# Patient Record
Sex: Male | Born: 1990
Health system: Southern US, Community
[De-identification: ages and names within clinical notes are randomized; demographics above are authoritative.]

## PROBLEM LIST (undated history)

## (undated) DIAGNOSIS — F329 Major depressive disorder, single episode, unspecified: Secondary | ICD-10-CM

## (undated) DIAGNOSIS — F32A Depression, unspecified: Secondary | ICD-10-CM

## (undated) DIAGNOSIS — F419 Anxiety disorder, unspecified: Secondary | ICD-10-CM

## (undated) HISTORY — DX: Anxiety disorder, unspecified: F41.9

---

## 2010-07-20 ENCOUNTER — Inpatient Hospital Stay (INDEPENDENT_AMBULATORY_CARE_PROVIDER_SITE_OTHER)
Admission: RE | Admit: 2010-07-20 | Discharge: 2010-07-20 | Disposition: A | Discharge status: Home / Self Care | Payer: Self-pay | Source: Ambulatory Visit | Attending: Family Medicine | Admitting: Family Medicine

## 2010-07-20 DIAGNOSIS — K120 Recurrent oral aphthae: Secondary | ICD-10-CM

## 2013-06-29 ENCOUNTER — Emergency Department (HOSPITAL_COMMUNITY)
Admission: EM | Admit: 2013-06-29 | Discharge: 2013-06-30 | Disposition: A | Discharge status: Other Acute Inpatient Hospital | Payer: Self-pay | Attending: Emergency Medicine | Admitting: Emergency Medicine

## 2013-06-29 ENCOUNTER — Encounter (HOSPITAL_COMMUNITY): Payer: Self-pay | Admitting: Emergency Medicine

## 2013-06-29 ENCOUNTER — Encounter (HOSPITAL_COMMUNITY): Payer: Self-pay | Admitting: *Deleted

## 2013-06-29 ENCOUNTER — Ambulatory Visit (HOSPITAL_COMMUNITY)
Admission: RE | Admit: 2013-06-29 | Discharge: 2013-06-29 | Disposition: A | Discharge status: Home / Self Care | Payer: Self-pay | Attending: Psychiatry | Admitting: Psychiatry

## 2013-06-29 DIAGNOSIS — Z880 Allergy status to penicillin: Secondary | ICD-10-CM | POA: Insufficient documentation

## 2013-06-29 DIAGNOSIS — F329 Major depressive disorder, single episode, unspecified: Secondary | ICD-10-CM | POA: Insufficient documentation

## 2013-06-29 DIAGNOSIS — R45851 Suicidal ideations: Secondary | ICD-10-CM | POA: Insufficient documentation

## 2013-06-29 DIAGNOSIS — Z0289 Encounter for other administrative examinations: Secondary | ICD-10-CM | POA: Insufficient documentation

## 2013-06-29 DIAGNOSIS — F3289 Other specified depressive episodes: Secondary | ICD-10-CM | POA: Insufficient documentation

## 2013-06-29 DIAGNOSIS — Z87891 Personal history of nicotine dependence: Secondary | ICD-10-CM | POA: Insufficient documentation

## 2013-06-29 HISTORY — DX: Major depressive disorder, single episode, unspecified: F32.9

## 2013-06-29 HISTORY — DX: Depression, unspecified: F32.A

## 2013-06-29 LAB — COMPREHENSIVE METABOLIC PANEL
ALT: 24 U/L (ref 0–53)
AST: 19 U/L (ref 0–37)
Albumin: 4.2 g/dL (ref 3.5–5.2)
Alkaline Phosphatase: 52 U/L (ref 39–117)
BUN: 10 mg/dL (ref 6–23)
CO2: 25 mEq/L (ref 19–32)
Calcium: 9.2 mg/dL (ref 8.4–10.5)
Chloride: 104 mEq/L (ref 96–112)
Creatinine, Ser: 0.84 mg/dL (ref 0.50–1.35)
GFR calc non Af Amer: >90 mL/min (ref >90)
GLUCOSE: 72 mg/dL (ref 70–99)
Potassium: 3.5 mEq/L — ABNORMAL LOW (ref 3.7–5.3)
Sodium: 143 mEq/L (ref 137–147)
TOTAL PROTEIN: 7.5 g/dL (ref 6.0–8.3)
Total Bilirubin: 0.4 mg/dL (ref 0.3–1.2)

## 2013-06-29 LAB — CBC
HCT: 41.1 % (ref 39.0–52.0)
HEMOGLOBIN: 14.4 g/dL (ref 13.0–17.0)
MCH: 29.5 pg (ref 26.0–34.0)
MCHC: 35 g/dL (ref 30.0–36.0)
MCV: 84.2 fL (ref 78.0–100.0)
Platelets: 228 10*3/uL (ref 150–400)
RBC: 4.88 MIL/uL (ref 4.22–5.81)
RDW: 12.5 % (ref 11.5–15.5)
WBC: 4.8 10*3/uL (ref 4.0–10.5)

## 2013-06-29 LAB — RAPID URINE DRUG SCREEN, HOSP PERFORMED
Amphetamines: NOT DETECTED
Barbiturates: NOT DETECTED
Benzodiazepines: NOT DETECTED
COCAINE: NOT DETECTED
Opiates: NOT DETECTED
Tetrahydrocannabinol: NOT DETECTED

## 2013-06-29 LAB — ACETAMINOPHEN LEVEL: Acetaminophen (Tylenol), Serum: <15 ug/mL (ref 10–30)

## 2013-06-29 LAB — SALICYLATE LEVEL: Salicylate Lvl: <2 mg/dL — ABNORMAL LOW (ref 2.8–20.0)

## 2013-06-29 LAB — ETHANOL

## 2013-06-29 MED ORDER — ONDANSETRON HCL 4 MG PO TABS: 4.0000 mg | ORAL_TABLET | Freq: Three times a day (TID) | ORAL | Status: DC | PRN

## 2013-06-29 MED ORDER — ALUM & MAG HYDROXIDE-SIMETH 200-200-20 MG/5ML PO SUSP: 30.0000 mL | ORAL | Status: DC | PRN

## 2013-06-29 MED ORDER — IBUPROFEN 200 MG PO TABS: 600.0000 mg | ORAL_TABLET | Freq: Three times a day (TID) | ORAL | Status: DC | PRN

## 2013-06-29 MED ORDER — LORAZEPAM 1 MG PO TABS
1.0000 mg | ORAL_TABLET | Freq: Three times a day (TID) | ORAL | Status: DC | PRN
Start: 2013-06-29 — End: 2013-06-30
  Administered 2013-06-29 – 2013-06-30 (×2): 1 mg via ORAL
  Filled 2013-06-29 (×2): qty 1

## 2013-06-29 MED ORDER — ACETAMINOPHEN 325 MG PO TABS: 650.0000 mg | ORAL_TABLET | ORAL | Status: DC | PRN
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
## 2013-06-29 NOTE — ED Provider Notes (Signed)
Medical screening examination/treatment/procedure(s) were performed by non-physician practitioner and as supervising physician I was immediately available for consultation/collaboration.  EKG Interpretation   None         Rolan BuccoMelanie Albeiro Trompeter, MD 06/29/13 2011

## 2013-06-29 NOTE — ED Notes (Signed)
Security called to wand pt  

## 2013-06-29 NOTE — ED Notes (Addendum)
AC called to inform of patient status.
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
## 2013-06-29 NOTE — ED Notes (Signed)
[facility] depression. States that he has had thoughts in the past couple of days of hurting himself but no plan at this time. 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

## 2013-06-29 NOTE — ED Notes (Signed)
Heather PA at bedside.  

## 2013-06-30 ENCOUNTER — Inpatient Hospital Stay (HOSPITAL_COMMUNITY)
Admission: AD | Admit: 2013-06-30 | Discharge: 2013-07-09 | DRG: 885 | Disposition: A | Discharge status: Home / Self Care | Payer: No Typology Code available for payment source | Source: Intra-hospital | Attending: Psychiatry | Admitting: Psychiatry

## 2013-06-30 ENCOUNTER — Encounter (HOSPITAL_COMMUNITY): Payer: Self-pay | Admitting: Behavioral Health

## 2013-06-30 DIAGNOSIS — R45851 Suicidal ideations: Secondary | ICD-10-CM

## 2013-06-30 DIAGNOSIS — Z87891 Personal history of nicotine dependence: Secondary | ICD-10-CM

## 2013-06-30 DIAGNOSIS — F410 Panic disorder [episodic paroxysmal anxiety] without agoraphobia: Secondary | ICD-10-CM | POA: Diagnosis present

## 2013-06-30 DIAGNOSIS — F1110 Opioid abuse, uncomplicated: Secondary | ICD-10-CM | POA: Diagnosis present

## 2013-06-30 DIAGNOSIS — F411 Generalized anxiety disorder: Secondary | ICD-10-CM | POA: Diagnosis present

## 2013-06-30 DIAGNOSIS — F329 Major depressive disorder, single episode, unspecified: Secondary | ICD-10-CM | POA: Diagnosis present

## 2013-06-30 DIAGNOSIS — F339 Major depressive disorder, recurrent, unspecified: Principal | ICD-10-CM | POA: Diagnosis present

## 2013-06-30 DIAGNOSIS — F1210 Cannabis abuse, uncomplicated: Secondary | ICD-10-CM | POA: Diagnosis present

## 2013-06-30 DIAGNOSIS — F1994 Other psychoactive substance use, unspecified with psychoactive substance-induced mood disorder: Secondary | ICD-10-CM | POA: Diagnosis present

## 2013-06-30 MED ORDER — IBUPROFEN 600 MG PO TABS
600.0000 mg | ORAL_TABLET | Freq: Four times a day (QID) | ORAL | Status: DC | PRN
  Administered 2013-06-30 – 2013-07-07 (×11): 600 mg via ORAL
  Filled 2013-06-30 (×11): qty 1

## 2013-06-30 MED ORDER — TRAZODONE HCL 50 MG PO TABS
50.0000 mg | ORAL_TABLET | Freq: Every evening | ORAL | Status: DC | PRN
  Filled 2013-06-30 (×18): qty 1

## 2013-06-30 MED ORDER — ALUM & MAG HYDROXIDE-SIMETH 200-200-20 MG/5ML PO SUSP
30.0000 mL | ORAL | Status: DC | PRN
Start: 2013-06-30 — End: 2013-07-09

## 2013-06-30 MED ORDER — ACETAMINOPHEN 325 MG PO TABS
650.0000 mg | ORAL_TABLET | Freq: Four times a day (QID) | ORAL | Status: DC | PRN
Start: 2013-06-30 — End: 2013-07-09

## 2013-06-30 MED ORDER — MAGNESIUM HYDROXIDE 400 MG/5ML PO SUSP
30.0000 mL | Freq: Every day | ORAL | Status: DC | PRN
Start: 2013-06-30 — End: 2013-07-09

## 2013-06-30 MED ORDER — HYDROXYZINE HCL 25 MG PO TABS
25.0000 mg | ORAL_TABLET | Freq: Four times a day (QID) | ORAL | Status: DC | PRN
  Administered 2013-06-30 – 2013-07-08 (×6): 25 mg via ORAL
  Filled 2013-06-30 (×6): qty 1

## 2013-06-30 NOTE — ED Provider Notes (Signed)
[facility] by Dr. Dub MikesLugo.    [name], MD 06/30/13 [phone]

## 2013-06-30 NOTE — ED Notes (Signed)
Pt. Standing at nursing station to place call to family.

## 2013-06-30 NOTE — ED Notes (Signed)
Patient oriented to room, bathroom, telephone use, visiting hours.  All info has been given to family members.
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
## 2013-06-30 NOTE — ED Notes (Signed)
Pt. Requested to have ativan before family visits with him. See [facility]

## 2013-06-30 NOTE — ED Notes (Signed)
Rickey EonSharon Guzman, Pts aunt called the unit wanting to know about placement and how soon.

## 2013-06-30 NOTE — ED Notes (Signed)
Patient has had ideas of hurting himself in the last couple of days.  Does not have a plan.  States he was taking Dilaudid and Roxy but stopped about 7 days ago and went through withdrawals for 4 days.  Is here for help with his thoughts.

## 2013-06-30 NOTE — ED Notes (Signed)
Pt. Expressed concerns about being anxious when d/c and commuting to other facility. RN explained the routine and protocol. Also reassured pt. That at no point will they be alone and can request med for anxiety.

## 2013-06-30 NOTE — ED Notes (Signed)
Pt. Is sleeping in bed with eyes closed.

## 2013-06-30 NOTE — BH Assessment (Signed)
06-30-2013 [facility] by Renata Capriceonrad, NP 06/30/2013. The attending is Dr. Elsie SaasJonnalagadda. Room assignment is 506-1. Nurse will have patient complete support paperwork. [facility] will transport.

## 2013-06-30 NOTE — ED Notes (Signed)
Ambulating in the hallway. Family has left.

## 2013-06-30 NOTE — ED Notes (Signed)
Sitter at bedside.

## 2013-06-30 NOTE — Progress Notes (Signed)
[facility] initiated inpatient placement at the following hospitals with bed availability:  1[facility] 4)[facility] 5[facility] 6)[facility] 7)[facility]  Blain PaisMichelle L Shauntelle Jamerson, [facility]/NS

## 2013-06-30 NOTE — BH Assessment (Signed)
Per shift report [facility] initiated inpatient placement at the following hospitals with bed availability 06/30/2012 @ 0459:  1)[facility]  [facility]  [facility]  4)[facility]  5[facility]  6)[facility]  7)[facility]   Writer followed up with the above referrals.  1)[facility]-No beds  2)[facility]-@ capacity 3)[facility]- Under review 4)[facility] - no beds but under review to be placed on their wait list 5)[facility]--Under review 6)[facility]-left a voicemail requesting staff to return call about their bed availability. 7)[facility]-No beds

## 2013-06-30 NOTE — ED Notes (Signed)
[facility] arrived Lorella NimrodHarvey to transport patient.

## 2013-06-30 NOTE — ED Notes (Signed)
Family members arrived. Security paged. Items secured and wanded per protocol.

## 2013-06-30 NOTE — ED Notes (Signed)
EMTALA reviewed by charge RN Melissa.

## 2013-06-30 NOTE — Tx Team (Signed)
Initial Interdisciplinary Treatment Plan  PATIENT STRENGTHS: (choose at least two) Ability for insight Active sense of humor Average or above average intelligence Communication skills General fund of knowledge Motivation for treatment/growth Physical Health Supportive family/friends  PATIENT STRESSORS: Legal issue Medication change or noncompliance Substance abuse   PROBLEM LIST: Problem List/Patient Goals Date to be addressed Date deferred Reason deferred Estimated date of resolution  I want to not be depressed anymore so I don't use drugs 06/30/2013     Substance abuse: cocaine, opiates, marijuana  06/30/2013     Medications adjustment and compliance 06/30/2013     Anxiety 06/30/2013                                    DISCHARGE CRITERIA:  Ability to meet basic life and health needs Improved stabilization in mood, thinking, and/or behavior Motivation to continue treatment in a less acute level of care Verbal commitment to aftercare and medication compliance  PRELIMINARY DISCHARGE PLAN: Attend aftercare/continuing care group Return to previous living arrangement Return to previous work or school arrangements  PATIENT/FAMIILY INVOLVEMENT: This treatment plan has been presented to and reviewed with the patient, Rickey Guzman.  The patient and family have been given the opportunity to ask questions and make suggestions.  Angeline SlimHill, Ashley M 06/30/2013, 8:00 PM

## 2013-06-30 NOTE — ED Notes (Signed)
Called [facility] for transport. 

## 2013-06-30 NOTE — BH Assessment (Signed)
06[facility]30-2013 [facility] by Nanine MeansJamison Lord, NP 06/29/13 and Renata Capriceonrad, NP 06/30/2013. The attending is Dr. Geoffery LyonsIrving Lugo. Room assignment is 302-1. Nurse will have patient complete support paperwork.  [facility] will transport.

## 2013-07-01 DIAGNOSIS — F1110 Opioid abuse, uncomplicated: Secondary | ICD-10-CM | POA: Diagnosis present

## 2013-07-01 DIAGNOSIS — F1210 Cannabis abuse, uncomplicated: Secondary | ICD-10-CM | POA: Diagnosis present

## 2013-07-01 DIAGNOSIS — F329 Major depressive disorder, single episode, unspecified: Secondary | ICD-10-CM

## 2013-07-01 MED ORDER — FLUOXETINE HCL 20 MG PO CAPS
20.0000 mg | ORAL_CAPSULE | Freq: Every day | ORAL | Status: DC
  Administered 2013-07-01: 20 mg via ORAL
  Filled 2013-07-01 (×3): qty 1

## 2013-07-01 MED ORDER — BUPROPION HCL ER (XL) 150 MG PO TB24
150.0000 mg | ORAL_TABLET | Freq: Every day | ORAL | Status: DC
  Administered 2013-07-02 – 2013-07-08 (×7): 150 mg via ORAL
  Filled 2013-07-01 (×8): qty 1
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
## 2013-07-01 NOTE — [facility] Group Notes (Signed)
[facility] LCSW Group Therapy  07/01/2013 2:33 PM  Type of Therapy:  Group Therapy  Participation Level:  Active  Participation Quality:  Attentive  Affect:  Depressed and Flat  Cognitive:  Alert and Oriented  Insight:  Improving  Engagement in Therapy:  Improving  Modes of Intervention:  Discussion, Education, Exploration, Problem-solving, Rapport Building, Socialization and Support  Summary of Progress/Problems: [facility] Speaker came to talk about his personal journey with substance abuse and addiction. The pt processed ways by which to relate to the speaker. MHA speaker provided handouts and educational information pertaining to groups and services offered by the [facility]. [name] remained attentive and engaged throughout today's presentation. He thanked the speaker for coming after group.    Smart, Luise Yamamoto LCSWA  07/01/2013, 2:33 PM

## 2013-07-01 NOTE — Progress Notes (Signed)
Recreation Therapy Notes  Animal-Assisted Activity/Therapy (AAA/T) Program Checklist/Progress Notes Patient Eligibility Criteria Checklist & Daily Group note for Rec Tx Intervention  Date: 01.20.2015 Time: 2:45pm Location: [address] Dayroom    AAA/T Program Assumption of Risk Form signed by Patient/ or Parent Legal Guardian yes  Patient is free of allergies or sever asthma yes  Patient reports no fear of animals yes  Patient reports no history of cruelty to animals yes   Patient understands his/her participation is voluntary yes  Patient washes hands before animal contact yes  Patient washes hands after animal contact yes  Behavioral Response: Appropriate  Education: Hand Washing, Appropriate Animal Interaction   Education Outcome: Acknowledges understanding   Clinical Observations/Feedback: Patient pet therapy dog, smiling while doing so. Patient interacted appropriately with peers during session.   Rickey Guzman L Treston Coker, LRT/CTRS  Rickey Guzman L 07/01/2013 4:19 PM 

## 2013-07-01 NOTE — [facility] Suicide Risk Assessment (Signed)
Suicide Risk Assessment  Admission Assessment     Nursing information obtained from:  Patient Demographic factors:  Male;Adolescent or young adult;Unemployed;Low socioeconomic status;Caucasian Current Mental Status:  Suicidal ideation indicated by patient Loss Factors:  Legal issues Historical Factors:    Risk Reduction Factors:  Sense of responsibility to family;Positive social support;Positive therapeutic relationship  CLINICAL FACTORS:   Severe Anxiety and/or Agitation Depression:   Anhedonia Comorbid alcohol abuse/dependence Hopelessness Impulsivity Insomnia Alcohol/Substance Abuse/Dependencies  COGNITIVE FEATURES THAT CONTRIBUTE TO RISK:  Closed-mindedness Polarized thinking    SUICIDE RISK:   Mild:  Suicidal ideation of limited frequency, intensity, duration, and specificity.  There are no identifiable plans, no associated intent, mild dysphoria and related symptoms, good self-control (both objective and subjective assessment), few other risk factors, and identifiable protective factors, including available and accessible social support.  PLAN OF CARE:1. Admit for crisis management and stabilization. 2. Medication management to reduce current symptoms to base line and improve the     patient's overall level of functioning 3. Treat health problems as indicated. 4. Develop treatment plan to decrease risk of relapse upon discharge and the need for     readmission. 5. Psycho-social education regarding relapse prevention and self care. 6. Health care follow up as needed for medical problems. 7. Restart home medications where appropriate.   I certify that inpatient services furnished can reasonably be expected to improve the patient's condition.  Skylie Hiott,MD 07/01/2013, 10:58 AM

## 2013-07-01 NOTE — Progress Notes (Signed)
D: Patient in the hallway on approach.  Patient minimal with Clinical research associatewriter.  Patient states he had a good day.  Patient states he had been attending groups and states he is learning a lot.  Patient states he has learned that there is help afterwards and that he is not the only one battling depression and substance abuse.  Patient passive SI but verbally contracts for safety.  Paient denies HI/AVH. A: Staff to monitor Q 15 mins for safety.  Encouragement and support offered.  Scheduled medications administered per orders. R: Patient remains safe on the unit.  Patient attended group tonight.  Patient visible on the unit and interacting with peers.  Patient taking administered medications.

## 2013-07-01 NOTE — [facility] Group Notes (Signed)
Adult Psychoeducational Group Note  Date:  07/01/2013 Time:  11:41 PM  Group Topic/Focus:  Wrap-Up Group:   The focus of this group is to help patients review their daily goal of treatment and discuss progress on daily workbooks.  Participation Level:  Active  Participation Quality:  Appropriate  Affect:  Appropriate  Cognitive:  Appropriate  Insight: Appropriate  Engagement in Group:  Engaged  Modes of Intervention:  Discussion  Additional Comments:  [name] said his day was descent.  He went to the groups and meals. He said he had a miniature breakdown before dinner but that his grandfather helped calm him down when he visited.  He also expressed he liked pet therapy.  Caroll RancherLindsay, Adalyna Godbee A 07/01/2013, 11:41 PM

## 2013-07-01 NOTE — [facility] Suicide Risk Assessment (Signed)
[facility] INPATIENT:  Family/Significant Other Suicide Prevention Education  Suicide Prevention Education:  Education Completed; Rickey Guzman (pt's grandfather) [phone] has been identified by the patient as the family member/significant other with whom the patient will be residing, and identified as the person(s) who will aid the patient in the event of a mental health crisis (suicidal ideations/suicide attempt).  With written consent from the patient, the family member/significant other has been provided the following suicide prevention education, prior to the and/or following the discharge of the patient.  The suicide prevention education provided includes the following:  Suicide risk factors  Suicide prevention and interventions  National Suicide Hotline telephone number  [facility] assessment telephone number  [facility] Emergency Assistance 911  [facility] and/or Residential Mobile Crisis Unit telephone number  Request made of family/significant other to:  Remove weapons (e.g., guns, rifles, knives), all items previously/currently identified as safety concern.    Remove drugs/medications (over-the-counter, prescriptions, illicit drugs), all items previously/currently identified as a safety concern.  The family member/significant other verbalizes understanding of the suicide prevention education information provided.  The family member/significant other agrees to remove the items of safety concern listed above.  Smart, HeatherLCSWA 07/01/2013, 11:02 AM

## 2013-07-01 NOTE — Progress Notes (Signed)
Patient ID: Myrtis SerKaleb D Moga, male   DOB: [birth year], 23 y.o.   MRN: [id number] He has been up and about and to groups interacting with peers and staff.  Self inventory: depression 5, hopelessness 5 denies W/D symptoms and pain. Had vistaril prn at 1210  For anxiety and it was effective.

## 2013-07-02 DIAGNOSIS — F1110 Opioid abuse, uncomplicated: Secondary | ICD-10-CM

## 2013-07-02 DIAGNOSIS — F1210 Cannabis abuse, uncomplicated: Secondary | ICD-10-CM

## 2013-07-02 NOTE — Progress Notes (Signed)
D: Patient appropriate and cooperative with staff and peers. Patient has depressed affect and mood. He reported on the self inventory sheet that he's sleeping fair, appetite and ability to pay attention are both improving and energy level is low. Patient rated depression "7" and feelings of hopelessness "5". He's participating in groups and visible in the milieu. Patient is adhering to current medication regimen.   A: Support and encouragement provided to patient. Administered scheduled medication per ordering MD. Monitor Q15 minute checks for safety.  R: Patient receptive. Passive SI, but contracts for safety. Denies HI and auditory/visual hallucinations. Patient remains safe on th unit.

## 2013-07-02 NOTE — [facility] Group Notes (Signed)
[facility]   07/02/2013 9:37 AM  Participation Quality:  Appropriate   Mood/Affect:  Appropriate  Depression Rating:  7  Anxiety Rating:  7  Thoughts of Suicide:  No Will you contract for safety?   NA  Current AVH:  No  Plan for Discharge/Comments:  Pt reports that he needs letter faxed to his lawyer with specific information today in order to avoid having warrant put out for his arrest. Pt reports no withdrawals and good sleep but states that his depression and anxiety is still high. [facility].   Transportation Means: family member  Supports: family Manufacturing engineermembers-grandfather, aunt, and mother  Smart, Lebron QuamHeather LCSWA

## 2013-07-02 NOTE — [facility] Group Notes (Signed)
Adult Psychoeducational Group Note  Date:  07/02/2013 Time:  10:05 PM  Group Topic/Focus:  AA Meeting  Participation Level:  Minimal  Participation Quality:  Appropriate  Affect:  Appropriate  Cognitive:  Appropriate  Insight: Limited  Engagement in Group:  Limited  Modes of Intervention:  Discussion and Education  Additional Comments:  Reyn attended Morgan StanleyA meeting.  Caroll RancherLindsay, Felica Chargois A 07/02/2013, 10:05 PM

## 2013-07-02 NOTE — Clinical Social Work Note (Signed)
Per pt request, CSW faxed generic letter including admission date and unknown discharge date info to lawyer-Scott Colter at [phone] number provided by pt and his aunt. Pt signed ROI and was given fax and fax confirmation for his records.  The Sherwin-WilliamsHeather Smart, LCSWA  07/02/2013 11:22 AM

## 2013-07-02 NOTE — [facility] Group Notes (Signed)
[facility] LCSW Group Therapy  07/02/2013 3:36 PM  Type of Therapy:  Group Therapy  Participation Level:  Active  Participation Quality:  Attentive  Affect:  Appropriate  Cognitive:  Alert and Oriented  Insight:  Engaged  Engagement in Therapy:  Engaged  Modes of Intervention:  Discussion, Education, Exploration, Limit-setting, Rapport Building, Socialization and Support  Summary of Progress/Problems: Emotion Regulation: This group focused on both positive and negative emotion identification and allowed group members to process ways to identify feelings, regulate negative emotions, and find healthy ways to manage internal/external emotions. Group members were asked to reflect on a time when their reaction to an emotion led to a negative outcome and explored how alternative responses using emotion regulation would have benefited them. Group members were also asked to discuss a time when emotion regulation was utilized when a negative emotion was experienced. Rickey Guzman was engaged and attentive throughout today's therapy group. He stated that he struggles with depression and anxiety. "these two emotions intertwine for me and often cause me to feel suicidal. I have to be around my family to feel safe." Rickey Guzman shows progress in the group setting and improving insight AEB his ability and improving insight AEB his ability to identify how having supportive family and pursuing hobbies such as hunting; working out; going for walks to [employer]"destress" can further assist him with managing depression and anxiety symptoms. Rickey Guzman also stated that being on medication to help him with mood stabilization may be beneficial to him.    Smart, Tenise Stetler LCSWA  07/02/2013, 3:36 PM
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
## 2013-07-02 NOTE — Progress Notes (Addendum)
[facility] MD Progress Note  07/02/2013 2:27 PM Rickey Guzman  MRN:  [id number]  Subjective:  Rickey Guzman continues to endorse high anxiety and depression levels. He rated both anxiety/depression at #8. He says that he is still having fleeting suicidal thoughts, however denies any intent and or plans to hurt himself and or others. Feels his antidepressant is trying to kick in. May decide to switch the dosing time from am to PM.  Diagnosis:   DSM5: Schizophrenia Disorders:  NA Obsessive-Compulsive Disorders:  NA Trauma-Stressor Disorders:  NA Substance/Addictive Disorders:  Cannabis Use Disorder - Moderate 9304.30) and Opioid Disorder - Moderate (304.00) Depressive Disorders:  Major Depressive Disorder - Moderate (296.22)  Axis I: Cannabis Use Disorder - Moderate 9304.30) and Opioid Disorder - Moderate (304.00), Major depressive disorder, recurrent Axis II: Deferred Axis III:  Past Medical History  Diagnosis Date  . Depression    Axis IV: other psychosocial or environmental problems, problems related to legal system/crime and problems with primary support group Axis V: 41-50 serious symptoms  ADL's:  Intact  Sleep: Good  Appetite:  Good  Suicidal Ideation:  Plan:  Denies Intent:  Denies Means:  Denies  Homicidal Ideation:  Plan:  Denies Intent:  Denies Means:  Denies  AEB (as evidenced by): per patient's reports  Psychiatric Specialty Exam: Review of Systems  Constitutional: Positive for malaise/fatigue.  Eyes: Negative.   Cardiovascular: Negative.   Gastrointestinal: Negative.   Genitourinary: Negative.   Musculoskeletal: Negative.   Skin: Negative.   Neurological: Negative.   Endo/Heme/Allergies: Negative.   Psychiatric/Behavioral: Positive for depression (Rated #8) and substance abuse (Opiate/Cannabis abuse). Negative for suicidal ideas, hallucinations and memory loss. The patient is nervous/anxious (Rated #8). The patient does not have insomnia.     Blood pressure  116/70, pulse 80, temperature 98 F (36.7 C), temperature source Oral, resp. rate 20, height 6' (1.829 m), weight 84.823 kg (187 lb), SpO2 100.00%.Body mass index is 25.36 kg/(m^2).  General Appearance: Casual and Fairly Groomed  Eye Contact::  Good  Speech:  Clear and Coherent  Volume:  Normal  Mood:  Anxious and Depressed, rated both at #8  Affect:  Flat  Thought Process:  Coherent, Goal Directed and Intact  Orientation:  Full (Time, Place, and Person)  Thought Content:  Rumination and Denies any hallucinations  Suicidal Thoughts:  Yes.  without intent/plan (fleeting)  Homicidal Thoughts:  No  Memory:  Immediate;   Good Recent;   Good Remote;   Good  Judgement:  Fair  Insight:  Fair  Psychomotor Activity:  High anxiety level  Concentration:  Fair  Recall:  Good  Akathisia:  No  Handed:  Right  AIMS (if indicated):     Assets:  Communication Skills Desire for Improvement Physical Health  Sleep:  Number of Hours: 6   Current Medications: Current Facility-Administered Medications  Medication Dose Route Frequency Provider Last Rate Last Dose  . acetaminophen (TYLENOL) tablet 650 mg  650 mg Oral Q6H PRN Kerry Hough, PA-C      . alum & mag hydroxide-simeth (MAALOX/MYLANTA) 200-200-20 MG/5ML suspension 30 mL  30 mL Oral Q4H PRN Kerry Hough, PA-C      . buPROPion (WELLBUTRIN XL) 24 hr tablet 150 mg  150 mg Oral Daily Sanjuana Kava, NP   150 mg at 07/02/13 0804  . hydrOXYzine (ATARAX/VISTARIL) tablet 25 mg  25 mg Oral Q6H PRN Kerry Hough, PA-C   25 mg at 07/01/13 2214  . ibuprofen (ADVIL,MOTRIN) tablet 600  mg  600 mg Oral Q6H PRN Kerry HoughSpencer E Simon, PA-C   600 mg at 07/01/13 1959  . magnesium hydroxide (MILK OF MAGNESIA) suspension 30 mL  30 mL Oral Daily PRN Kerry HoughSpencer E Simon, PA-C      . traZODone (DESYREL) tablet 50 mg  50 mg Oral QHS,MR X 1 Kerry HoughSpencer E Simon, PA-C        Lab Results: No results found for this or any previous visit (from the past 48 hour(s)).  Physical  Findings: AIMS: Facial and Oral Movements Muscles of Facial Expression: None, normal Lips and Perioral Area: None, normal Jaw: None, normal Tongue: None, normal,Extremity Movements Upper (arms, wrists, hands, fingers): None, normal Lower (legs, knees, ankles, toes): None, normal, Trunk Movements Neck, shoulders, hips: None, normal, Overall Severity Severity of abnormal movements (highest score from questions above): None, normal Incapacitation due to abnormal movements: None, normal Patient's awareness of abnormal movements (rate only patient's report): No Awareness, Dental Status Current problems with teeth and/or dentures?: No Does patient usually wear dentures?: No  CIWA:  CIWA-Ar Total: 0 COWS:     Treatment Plan Summary: Daily contact with patient to assess and evaluate symptoms and progress in treatment Medication management  Plan: Review of chart, vital signs, medications, and notes. 1- Individual and group therapy 2- Medication management for depression and anxiety:  Medications reviewed with the patient, may change dosing time for Wellbutrin from am to Q hs. 3- Coping skills for depression, anxiety, and substance dependency 4- Continue crisis stabilization and management 5- Address health issues--monitoring vital signs, stable 6- Treatment plan in progress to prevent relapse of depression, substance abuse, and anxiety  Medical Decision Making Problem Points:  Established problem, stable/improving (1) Data Points:  Review of medication regiment & side effects (2) Review of new medications or change in dosage (2)  I certify that inpatient services furnished can reasonably be expected to improve the patient's condition.   Sanjuana Kavawoko, Agnes I, PMHNP-BC 07/02/2013, 2:27 PM  I agreed with findings and treatment plan of this patient Thedore MinsMojeed Beuna Bolding, MD

## 2013-07-02 NOTE — Progress Notes (Signed)
D: Patient in the hallway on approach.  Patient tearful and states he received bad news today.  Patient states he found out from his family that there is a possibility he may have to spend 30 days in jail due to being here.  Patient states he is supposed to serve weekends in jail and due to him being here he wont be able to.  Patient states, "I can't not go back to jail I will kill myself before I go back."  Patient passive SI but verbally contracts for safety.  Patient denies HI and denies AVH.   A: Staff to monitor Q 15 mins for safety.  Encouragement and support offered.  No scheduled medications administered per orders. R: Patient remains safe on the unit.  Patient attended group tonight.  Patient taking administered medications.  Patient visible on the unit and interacting with peers.

## 2013-07-03 DIAGNOSIS — F410 Panic disorder [episodic paroxysmal anxiety] without agoraphobia: Secondary | ICD-10-CM | POA: Diagnosis present

## 2013-07-03 DIAGNOSIS — F411 Generalized anxiety disorder: Secondary | ICD-10-CM | POA: Diagnosis present

## 2013-07-03 MED ORDER — GABAPENTIN 100 MG PO CAPS
100.0000 mg | ORAL_CAPSULE | Freq: Three times a day (TID) | ORAL | Status: DC
Start: 2013-07-03 — End: 2013-07-04
  Administered 2013-07-03 – 2013-07-04 (×4): 100 mg via ORAL
  Filled 2013-07-03 (×9): qty 1

## 2013-07-03 NOTE — [facility] Group Notes (Signed)
[facility] LCSW Group Therapy  07/03/2013 2:42 PM  Type of Therapy:  Group Therapy  Participation Level:  Active  Participation Quality:  Attentive  Affect:  Appropriate  Cognitive:  Alert and Oriented  Insight:  Engaged  Engagement in Therapy:  Engaged  Modes of Intervention:  Confrontation, Discussion, Education, Exploration, Problem-solving, Rapport Building, Socialization and Support  Summary of Progress/Problems:  Finding Balance in Life. Today's group focused on defining balance in one's own words, identifying things that can knock one off balance, and exploring healthy ways to maintain balance in life. Group members were asked to provide an example of a time when they felt off balance, describe how they handled that situation,and process healthier ways to regain balance in the future. Group members were asked to share the most important tool for maintaining balance that they [facility] and how they plan to apply this method after discharge. Rickey Guzman was attentive and engaged throughout today's therapy group. He shared that "panic attacks, legal problems, and family issues" have contributed to the imbalance he feels currently. Rickey Guzman shows progress in the group setting and improving insight AEB his ability to process how he abuses substances in an attempt to reestablish balance but how this in turn further knocks him off balance in life. Rickey Guzman shared that he thinks inpatient treatment will be necessary for him to learn coping skills to help deal with life stressors without turning to drugs.    Smart, Rickey Guzman LCSWA  07/03/2013, 2:42 PM

## 2013-07-03 NOTE — Progress Notes (Signed)
Recreation Therapy Notes  Date: 01.21.2015 Time: 3:00pm Location: [address] Dayroom   Group Topic: Communication, Team Building, Problem Solving  Goal Area(s) Addresses:  Patient will effectively work with peer towards shared goal.  Patient will identify skill used to make activity successful.  Patient will identify how skills used during activity can be used to reach post d/c goals.   Behavioral Response: Appropriate   Intervention: Problem Solving Activity  Activity: Landing Pad. In teams patients were given 12 plastic drinking straws and a length of masking tape. Using the materials provided patients were asked to build a landing pad to catch a golf ball dropped from approximately 6 feet in the air.   Education: Pharmacist, communityocial Skills, Building control surveyorDischarge Planning.   Education Outcome: Acknowledges understanding  Clinical Observations/Feedback: Patient actively engaged in group activity, sharing ideas with teammates and assisting with construction of team's landing pad. Patient contributed to group discussion, identifying qualities used to guide his decision making, patient additionally identified positive impact using group skills effectively could have on his life.   Marykay Lexenise L Clint Biello, LRT/CTRS  Clayson Riling L 07/03/2013 8:58 AM

## 2013-07-03 NOTE — Progress Notes (Signed)
D: pt sitting in dayroom engaging with other, being very friendly with pt amber. Writer spoke with pt alone. Writer informed pt that the unit is not the place to find love or friends, the purpose of the pt being here is to better himself. Pt agreed and stated there is nothing going on between him and the other pt, she is just something to help make the days go by faster. Denies si/hi/avh at the moment, but stated he was having si earlier in the morning, it comes and goes as it pleases. Pt contracts for safety. Pt c/o pain in lower back and joints. Pt stated he knows the pain is due to his chronic use of drugs. Pt is cooperative A: ibuprofen given for pain. q 15 min safety checks R: pt remains safe on unit

## 2013-07-03 NOTE — Progress Notes (Signed)
Recreation Therapy Notes  Animal-Assisted Activity/Therapy (AAA/T) Program Checklist/Progress Notes Patient Eligibility Criteria Checklist & Daily Group note for Rec Tx Intervention  Date: 01.22.2015 Time: 2:45pm [address]: [address]    AAA/T Program Assumption of Risk Form signed by Patient/ or Parent Legal Guardian yes  Patient is free of allergies or sever asthma yes  Patient reports no fear of animals yes  Patient reports no history of cruelty to animals yes   Patient understands his/her participation is voluntary yes  Patient washes hands before animal contact yes  Patient washes hands after animal contact yes  Behavioral Response: Engaged, Appropraite  Education: Charity fundraiserHand Washing, Appropriate Animal Interaction   Education Outcome: Acknowledges understanding  Clinical Observations/Feedback: Patient interacted appropriately with peers and therapy dog team.   Jearl Klinefelterenise L Naven Giambalvo, LRT/CTRS  Issabella Rix L 07/03/2013 3:41 PM

## 2013-07-03 NOTE — Progress Notes (Signed)
D: Patient has depressed affect and anxious mood; also presenting with a worried appearance as well. He has been somewhat tearful this morning, expressing concern about speaking with a sheriff regarding probation and some time that he's suppose to be serving right now. Patient did get the opportunity today to have a conversation with the sheriff to get clarification about the steps to take next after he's been discharged from the hospital. He's attending groups and compliant with his medications.  A: Support and encouragement provided to patient. Scheduled medications administered per MD orders. Maintain Q15 minute checks for safety.  R: Patient receptive. Passive SI, but contracts for safety. Denies homicidal ideation. Patient remains safe.

## 2013-07-03 NOTE — Progress Notes (Signed)
Adult Psychoeducational Group Note  Date:  07/03/2013 Time:  9:00 AM  Group Topic/Focus:  Morning Wellness Group  Participation Level:  Active  Participation Quality:  Appropriate and Attentive  Affect:  Appropriate  Cognitive:  Alert and Oriented  Insight: Appropriate  Engagement in Group:  Engaged  Modes of Intervention:  Discussion  Additional Comments:  Pt. stated a goal for him is to work on his mental stability.  [name], [name] 07/03/2013, 9:51 AM

## 2013-07-03 NOTE — Progress Notes (Signed)
Patient ID: Rickey Guzman, male   DOB: [birth year], 23 y.o.   MRN: [id number] Pt attended AA group.

## 2013-07-03 NOTE — Progress Notes (Signed)
Patient ID: Rickey Guzman, male   DOB: 1990/06/17, 23 y.o.   MRN: [id number] Pt went down stairs to the gym for fitness. He played basketball the entire time.

## 2013-07-03 NOTE — Progress Notes (Addendum)
[facility] Group Notes:  (Nursing/MHT/Case Management/Adjunct)  Date:  07/03/2013   Time: 2100  Type of Therapy:  wrap up group  Participation Level:  Active  Participation Quality:  Inattentive, Redirectable and Sharing  Affect:  Appropriate  Cognitive:  Appropriate  Insight:  Lacking  Engagement in Group:  Limited  Modes of Intervention:  Clarification, Education and Support  Summary of Progress/Problems: Pt reports feeling super depressed but feeling better after having talked with Dr. Dub MikesLugo. Pt plans on attending [facility] upon discharge but expresses anxiety about going. Pt reports needing to go but concerned about strict phone times and schedules.   Shelah LewandowskySquires, Teanna Elem Carol 07/03/2013, 9:28 PM

## 2013-07-03 NOTE — Progress Notes (Signed)
[facility] MD Progress Note  07/03/2013 2:49 PM Rickey Guzman  MRN:  [id number] Subjective:  Rickey Guzman endorses that he continues to have a hard time. States that every time he starts thinking of having to go back to jail he panics and starts feeling like killing himself. States he was in the process of getting his life back together, had a job, but he had to do weekends in jail due to a pos MJ test. He lost his job due to not being able to be there. He panicked and panic every time when he thinks about going back there Diagnosis:   DSM5: Schizophrenia Disorders:  none Obsessive-Compulsive Disorders:  none Trauma-Stressor Disorders:  none Substance/Addictive Disorders:  Cannabis Use Disorder - Moderate 9304.30) and Opioid Disorder - Moderate (304.00) Depressive Disorders:  Major Depressive Disorder - Moderate (296.22)  Axis I: Anxiety Disorder NOS and Panic Disorder  ADL's:  Intact  Sleep: Poor  Appetite:  Fair  Suicidal Ideation:  Plan:  denies Intent:  denies Means:  denies Homicidal Ideation:  Plan:  denies Intent:  denies Means:  denies AEB (as evidenced by):  Psychiatric Specialty Exam: Review of Systems  Constitutional: Negative.   HENT: Negative.   Eyes: Negative.   Respiratory: Negative.   Cardiovascular: Negative.   Gastrointestinal: Negative.   Genitourinary: Negative.   Musculoskeletal: Negative.   Skin: Negative.   Neurological: Negative.   Endo/Heme/Allergies: Negative.   Psychiatric/Behavioral: Positive for depression, suicidal ideas and substance abuse. The patient is nervous/anxious and has insomnia.     Blood pressure 127/76, pulse 70, temperature 97.7 F (36.5 C), temperature source Oral, resp. rate 18, height 6' (1.829 m), weight 84.823 kg (187 lb), SpO2 100.00%.Body mass index is 25.36 kg/(m^2).  General Appearance: Fairly Groomed  Patent attorney::  Fair  Speech:  Clear and Coherent  Volume:  fluctuates  Mood:  Anxious, Depressed and worried  Affect:   Depressed, Tearful and anxious  Thought Process:  Coherent and Goal Directed  Orientation:  Full (Time, Place, and Person)  Thought Content:  symptoms, worries, concerns  Suicidal Thoughts:  Yes, with plans no intent at this moment  Homicidal Thoughts:  No  Memory:  Immediate;   Fair Recent;   Fair Remote;   Fair  Judgement:  Fair  Insight:  Present  Psychomotor Activity:  Restlessness  Concentration:  Fair  Recall:  Fair  Akathisia:  No  Handed:    AIMS (if indicated):     Assets:  Desire for Improvement  Sleep:  Number of Hours: 6.25   Current Medications: Current Facility-Administered Medications  Medication Dose Route Frequency Provider Last Rate Last Dose  . acetaminophen (TYLENOL) tablet 650 mg  650 mg Oral Q6H PRN Kerry Hough, PA-C      . alum & mag hydroxide-simeth (MAALOX/MYLANTA) 200-200-20 MG/5ML suspension 30 mL  30 mL Oral Q4H PRN Kerry Hough, PA-C      . buPROPion (WELLBUTRIN XL) 24 hr tablet 150 mg  150 mg Oral Daily Sanjuana Kava, NP   150 mg at 07/03/13 0815  . gabapentin (NEURONTIN) capsule 100 mg  100 mg Oral TID Rachael Fee, MD   100 mg at 07/03/13 1154  . hydrOXYzine (ATARAX/VISTARIL) tablet 25 mg  25 mg Oral Q6H PRN Kerry Hough, PA-C   25 mg at 07/02/13 2157  . ibuprofen (ADVIL,MOTRIN) tablet 600 mg  600 mg Oral Q6H PRN Kerry Hough, PA-C   600 mg at 07/02/13 2157  . magnesium hydroxide (  MILK OF MAGNESIA) suspension 30 mL  30 mL Oral Daily PRN Kerry HoughSpencer E Simon, PA-C      . traZODone (DESYREL) tablet 50 mg  50 mg Oral QHS,MR X 1 Kerry HoughSpencer E Simon, PA-C        Lab Results: No results found for this or any previous visit (from the past 48 hour(s)).  Physical Findings: AIMS: Facial and Oral Movements Muscles of Facial Expression: None, normal Lips and Perioral Area: None, normal Jaw: None, normal Tongue: None, normal,Extremity Movements Upper (arms, wrists, hands, fingers): None, normal Lower (legs, knees, ankles, toes): None, normal, Trunk  Movements Neck, shoulders, hips: None, normal, Overall Severity Severity of abnormal movements (highest score from questions above): None, normal Incapacitation due to abnormal movements: None, normal Patient's awareness of abnormal movements (rate only patient's report): No Awareness, Dental Status Current problems with teeth and/or dentures?: No Does patient usually wear dentures?: No  CIWA:  CIWA-Ar Total: 0 COWS:     Treatment Plan Summary: Daily contact with patient to assess and evaluate symptoms and progress in treatment Medication management  Plan: Supportive approach/coping skills/relape prevention           Continue the trial with Wellbutrin XL 150 mg daily (has tried Celexa, Zoloft, Paxil, Lexapro with no benefit after several months of daily use)           Trial with Neurontin to help with the anxiety, panic feeling           CBT;mindfulness Medical Decision Making Problem Points:  Established problem, worsening (2) and Review of psycho-social stressors (1) Data Points:  Review of medication regiment & side effects (2) Review of new medications or change in dosage (2)  I certify that inpatient services furnished can reasonably be expected to improve the patient's condition.   Kortny Lirette A 07/03/2013, 2:49 PM

## 2013-07-04 DIAGNOSIS — F410 Panic disorder [episodic paroxysmal anxiety] without agoraphobia: Secondary | ICD-10-CM

## 2013-07-04 DIAGNOSIS — F411 Generalized anxiety disorder: Secondary | ICD-10-CM

## 2013-07-04 MED ORDER — GABAPENTIN 100 MG PO CAPS
200.0000 mg | ORAL_CAPSULE | Freq: Three times a day (TID) | ORAL | Status: DC
Start: 2013-07-04 — End: 2013-07-07
  Administered 2013-07-04 – 2013-07-06 (×7): 200 mg via ORAL
  Filled 2013-07-04 (×11): qty 2

## 2013-07-04 NOTE — Progress Notes (Signed)
[facility]  07/04/2013 2:30 PM Rickey Guzman  MRN:  [id number] Subjective:  Rickey Guzman states that he can be OK when he is around other people but once he is by himself he starts thinking, ruminating. He states that the thought of having to go back to jail makes his anxiety, panic feelings worst and the thoughts of killing himself come back. He admits he has been trying to work trough the fact that he will always be a felon and that at least for the next 6 years he will have to register as a sex offender ( It was 10 years. He  cant belief that 4 years have gone so fast.) he is planning to build his skills up to be self employed given his felon status Diagnosis:   DSM5: Schizophrenia Disorders:  none Obsessive-Compulsive Disorders:  none Trauma-Stressor Disorders:  none Substance/Addictive Disorders:  Cannabis Use Disorder - Moderate 9304.30) and Opioid Disorder - Moderate (304.00) Depressive Disorders:  Major Depressive Disorder - Moderate (296.22)  Axis I: Anxiety Disorder NOS and Panic Disorder  ADL's:  Intact  Sleep: Poor  Appetite:  Fair  Suicidal Ideation:  Plan:  denies Intent:  denies Means:  denies Homicidal Ideation:  Plan:  denies Intent:  denies Means:  denies AEB (as evidenced by):  Psychiatric Specialty Exam: Review of Systems  Constitutional: Negative.   HENT: Negative.   Eyes: Negative.   Respiratory: Negative.   Cardiovascular: Negative.   Gastrointestinal: Negative.   Genitourinary: Negative.   Musculoskeletal: Negative.   Skin: Negative.   Neurological: Positive for tremors.  Endo/Heme/Allergies: Negative.   Psychiatric/Behavioral: Positive for depression and substance abuse. The patient is nervous/anxious.     Blood pressure 146/78, pulse 90, temperature 97.3 F (36.3 C), temperature source Oral, resp. rate 16, height 6' (1.829 m), weight 84.823 kg (187 lb), SpO2 100.00%.Body mass index is 25.36 kg/(m^2).  General Appearance: Fairly Groomed   Patent attorneyye Contact::  Fair  Speech:  Clear and Coherent  Volume:  fluctuates  Mood:  Anxious, Depressed and worried  Affect:  anxious, worried  Thought Process:  Coherent and Goal Directed  Orientation:  Full (Time, Place, and Person)  Thought Content:  events, worries, concerns  Suicidal Thoughts:  Yes.  without intent/plan  Homicidal Thoughts:  No  Memory:  Immediate;   Fair Recent;   Fair Remote;   Fair  Judgement:  Fair  Insight:  Present  Psychomotor Activity:  Restlessness  Concentration:  Fair  Recall:  Fair  Akathisia:  No  Handed:    AIMS (if indicated):     Assets:  Desire for Improvement Social Support  Sleep:  Number of Hours: 3.5   Current Medications: Current Facility-Administered Medications  Medication Dose Route Frequency Provider Last Rate Last Dose  . acetaminophen (TYLENOL) tablet 650 mg  650 mg Oral Q6H PRN Kerry HoughSpencer E Simon, PA-C      . alum & mag hydroxide-simeth (MAALOX/MYLANTA) 200-200-20 MG/5ML suspension 30 mL  30 mL Oral Q4H PRN Kerry HoughSpencer E Simon, PA-C      . buPROPion (WELLBUTRIN XL) 24 hr tablet 150 mg  150 mg Oral Daily Sanjuana KavaAgnes I Nwoko, NP   150 mg at 07/04/13 0809  . gabapentin (NEURONTIN) capsule 200 mg  200 mg Oral TID Rachael FeeIrving A Lasean Gorniak, MD      . hydrOXYzine (ATARAX/VISTARIL) tablet 25 mg  25 mg Oral Q6H PRN Kerry HoughSpencer E Simon, PA-C   25 mg at 07/02/13 2157  . ibuprofen (ADVIL,MOTRIN) tablet 600 mg  600 mg Oral Q6H PRN Kerry Hough, PA-C   600 mg at 07/03/13 2010  . magnesium hydroxide (MILK OF MAGNESIA) suspension 30 mL  30 mL Oral Daily PRN Kerry Hough, PA-C      . traZODone (DESYREL) tablet 50 mg  50 mg Oral QHS,MR X 1 Kerry Hough, PA-C        Lab Results: No results found for this or any previous visit (from the past 48 hour(s)).  Physical Findings: AIMS: Facial and Oral Movements Muscles of Facial Expression: None, normal Lips and Perioral Area: None, normal Jaw: None, normal Tongue: None, normal,Extremity Movements Upper (arms, wrists,  hands, fingers): None, normal Lower (legs, knees, ankles, toes): None, normal, Trunk Movements Neck, shoulders, hips: None, normal, Overall Severity Severity of abnormal movements (highest score from questions above): None, normal Incapacitation due to abnormal movements: None, normal Patient's awareness of abnormal movements (rate only patient's report): No Awareness, Dental Status Current problems with teeth and/or dentures?: No Does patient usually wear dentures?: No  CIWA:  CIWA-Ar Total: 0 COWS:     Treatment Plan Summary: Daily contact with patient to assess and evaluate symptoms and progress in treatment Medication management  Plan: Supportive approach/coping skills/relapse prevention           Optimize treatment with psychotropics  Medical Decision Making Problem Points:  Review of psycho-social stressors (1) Data Points:  Review of medication regiment & side effects (2) Review of new medications or change in dosage (2)  I certify that inpatient services furnished can reasonably be expected to improve the patient's condition.   Inioluwa Baris A 07/04/2013, 2:30 PM

## 2013-07-04 NOTE — Progress Notes (Signed)
[facility] Group Notes:  (Nursing/MHT/Case Management/Adjunct)  Date:  07/04/2013  Time:  9:49 PM  Type of Therapy:  Group Therapy  Participation Level:  Active  Participation Quality:  Appropriate  Affect:  Appropriate  Cognitive:  Appropriate  Insight:  Appropriate  Engagement in Group:  Supportive  Modes of Intervention:  Support  Summary of Progress/Problems: AA, pt was attentive during group session.  Sondra ComeWilson, Cyan Clippinger J 07/04/2013, 9:49 PM

## 2013-07-04 NOTE — [facility] Group Notes (Signed)
[facility] LCSW Group Therapy  07/04/2013 2:38 PM  Type of Therapy:  Group Therapy   Participation Level:  Active  Participation Quality:  Appropriate  Affect:  Appropriate  Cognitive:  Appropriate  Insight:  Engaged  Engagement in Therapy:  Engaged  Modes of Intervention:  Discussion and Socialization   Summary of Progress/Problems:  Chaplain was here to lead a group on themes of hope and courage. [name] stated that hope is not sticking a needle in his arm.  He described using heroin as a feeling of hopelessness.  Although Javyn but did not expect to land here, he has been surprised to find people at the hospital that he has connected with.   He stated "I have to know that there is a life after this, even with depression, anxiety, and for me, drugs."  He discussed his lack of supports in his life.  One of his hopes is to start a business.     Simona Huhina Yang   07/04/2013  2:38 PM

## 2013-07-04 NOTE — Clinical Social Work Note (Signed)
CSW spoke with pt and his aunt regarding letter for lawyer. Letter written and copy in chart/signed by MD. Letter and copy given to pt per his aunt's request. [facility]   The Sherwin-WilliamsHeather Smart, LCSWA  07/04/2013 1:33 PM

## 2013-07-04 NOTE — Progress Notes (Signed)
Adult Psychoeducational Group Note  Date:  07/04/2013 Time:  4:56 PM  Group Topic/Focus:  Relapse Prevention Planning:   The focus of this group is to define relapse and discuss the need for planning to combat relapse.  Participation Level:  Appropriate  Participation Quality: Appropriate, Sharing  Affect:  Appropriate, Drowsy  Cognitive: Good  Insight: Appropriate  Engagement in Group: Engaged  Modes of Intervention:  Discussion, Education, Socialization and Support  Additional Comments:  Pts discussed what relapse is to them, and how there are many different types of relapse such has falling back into old/unhealthy thought patterns/behaviors. Pts discussed different triggers that begin the start of their relapse and what they can do to replace those unhealthy behaviors once they identify their triggers and early warning signs. Pt stated one of his biggest triggers is dealing with his anxiety and depression and getting it under control. Pt states that most of the times he internalizes his feelings and one thing he could change to help prevent his relapse is to talk about how he his feeling and build his support when he starts feeling bad.  Caswell CorwinOwen, Rotunda Worden C 07/04/2013, 4:56 PM

## 2013-07-04 NOTE — Progress Notes (Signed)
D: Patient's affect is appropriate to circumstance and mood is depressed. Continues to attend scheduled groups throughout the day. Visible in the milieu and interactive with peers in the dayroom. Patient adheres and is tolerating medications well.  A: Support and encouragement provided to patient. Administered scheduled medications per ordering MD. Monitor Q15 minute checks for safety.  R: Patient receptive. Denies SI/HI and auditory/visual hallucinations. Patient remains safe on the unit.

## 2013-07-04 NOTE — Progress Notes (Signed)
Recreation Therapy Notes  Date: 01.23.2015 Time: 2:45pm Location: [address] Dayroom   Group Topic: Building Healthy Support System   Goal Area(s) Addresses:  Patient will identify qualities needed to build healthy support system. Patient will identify why those qualities are important.   Behavioral Response: Appropriate   Intervention: Scenario  Activity: Patients were asked to identify all qualities needed to build a healthy support system, as if they were equivalent to ingredients needed to make a cookie recipe.    Education:  Pharmacist, communityocial Skills, Building control surveyorDischarge Planning,   Education Outcome: Acknowledges understanding  Clinical Observations/Feedback: Patient arrived late to group session, upon arrival patient listened intently to group discussion. Group discussion focused around qualities needed for building a healthy support system, development of coping skills and the importance of trust and communication to relationships. Patient agreed with and supported peers as needed. Patient did not volunteer personal information, but was respectful of peers as they were sharing.   Marykay Lexenise L Chistopher Mangino, LRT/CTRS  Kyrra Prada L 07/04/2013 4:21 PM

## 2013-07-04 NOTE — [facility] Group Notes (Signed)
[facility]   07/04/2013 9:32 AM  Participation Quality:  Appropriate   Mood/Affect:  Flat  Depression Rating:  5  Anxiety Rating:  7  Thoughts of Suicide:  Yes Will you contract for safety?   Yes  Current AVH:  No  Plan for Discharge/Comments:  Pt reporting passive SI this morning (intrusive thoughts and anxiety). Pt reports no signs of withdrawal and is [facility]. CSW waiting for call back with this date.   Transportation Means: family member  Supports: grandfather, aunt, and mother  Smart, Lebron QuamHeather LCSWA

## 2013-07-05 NOTE — Progress Notes (Signed)
Patient ID: Rickey Guzman, male   DOB: 07-Jul-1990, 23 y.o.   MRN: [id number] Pt visible in the milieu.  Interacting appropriately with staff and peers.  Attending groups.  Pt with c/o back pain (see Mar).  Support and encouragement provided.  Pt receptive.  Fifteen minute checks continue for patient safety.  Pt safe on unit.

## 2013-07-05 NOTE — Progress Notes (Signed)
Patient did attend the evening speaker AA meeting.  

## 2013-07-05 NOTE — [facility] Group Notes (Signed)
[facility] LCSW Group Therapy Note  07/05/2013 10:05 - 11:00 AM  Type of Therapy and Topic:  Group Therapy: Avoiding Self-Sabotaging and Enabling Behaviors  Participation Level:  Minimal   Mood: Depressed  Description of Group:     Learn how to identify obstacles, self-sabotaging and enabling behaviors, what are they, why do we do them and what needs do these behaviors meet? Discuss unhealthy relationships and how to have positive healthy boundaries with those that sabotage and enable. Explore aspects of self-sabotage and enabling in yourself and how to limit these self-destructive behaviors in everyday life.  Therapeutic Goals: 1. Patient will identify one obstacle that relates to self-sabotage and enabling behaviors 2. Patient will identify one personal self-sabotaging or enabling behavior they did prior to admission 3. Patient able to establish a plan to change the above identified behavior they did prior to admission:  4. Patient will demonstrate ability to communicate their needs through discussion and/or role plays.   Summary of Patient Progress: The main focus of today's process group was to explain what "self-sabotage" means and use Motivational Interviewing to discuss what benefits, negative or positive, were involved in a self-identified self-sabotaging behavior. We then talked about reasons the patient may want to change the behavior and his current desire to change. Patient shared that he came to hospital for help with pain, anxiety and depression. Patient was quietly attentive to group processing.   Therapeutic Modalities:   Cognitive Behavioral Therapy Person-Centered Therapy Motivational Interviewing   Rickey Bernatherine C Harrill, LCSW

## 2013-07-05 NOTE — Progress Notes (Signed)
D.  Pt pleasant on approach, denies complaints at this time.  Positive for evening AA group, interacting appropriately within milieu.  Denies SI/HI/hallucinations at this time.  A.  Support and encouragement offered  R.  Pt remains safe on unit, will continue to monitor. 

## 2013-07-05 NOTE — Progress Notes (Signed)
Psychoeducational Group Note  Date:  07/05/2013 Time:  1315  Group Topic/Focus:  Identifying Needs:   The focus of this group is to help patients identify their personal needs that have been historically problematic and identify healthy behaviors to address their needs.  Participation Level:  Minimal  Participation Quality:  Appropriate  Affect:  Appropriate  Cognitive:  Appropriate  Insight:  Improving  Engagement in Group:  Improving  Additional Comments:    Andrena Mewsuttall, Verona Hartshorn J 07/05/2013,2:40 PM

## 2013-07-05 NOTE — Progress Notes (Signed)
D: pt c/o of high anxiety an depression this evening. Pt stated his depression and anxiety spiked out of control earlier this afternoon. Pt stated he was having racing thoughts and could not keep focusing on things he has no control over and negative things in the past that have led him to his situation. Pt stated he has started to calm down as the night has progressed, but still having some passive si. Pt contracts for safety. Denies hi/avh. Denies pain. Pt has letter on top of chart for aunt or grandfather to pick up tomorrow evening A: 1:1 time offered. q 15 min safety checks R: pt remains safe on unit. No further complaints at this time or signs of distress

## 2013-07-05 NOTE — Progress Notes (Signed)
Patient ID: Rickey Guzman Schrum, male   DOB: 02-11-1991, 23 y.o.   MRN: [id number] [facility] MD Progress Note  07/05/2013 2:46 PM Rickey Guzman Statzer  MRN:  [id number]  Subjective:  Rickey Guzman reports that he is feeling a little better today. He is says he will be going [facility] after his discharge from this hospital.  He is rating his anxiety at #8 and depression at #6. He says although, there is hope for him, he can't help to remember that he is considered a felon. He says this is some status he can never get used to realizing that, that is what it is for him at this time.  Diagnosis:   DSM5: Schizophrenia Disorders:  none Obsessive-Compulsive Disorders:  none Trauma-Stressor Disorders:  none Substance/Addictive Disorders:  Cannabis Use Disorder - Moderate 9304.30) and Opioid Disorder - Moderate (304.00) Depressive Disorders:  Major Depressive Disorder - Moderate (296.22)  Axis I: Anxiety Disorder NOS and Panic Disorder  ADL's:  Intact  Sleep: Poor  Appetite:  Fair  Suicidal Ideation:  Plan:  denies Intent:  denies Means:  denies Homicidal Ideation:  Plan:  denies Intent:  denies Means:  denies AEB (as evidenced by):  Psychiatric Specialty Exam: Review of Systems  Constitutional: Negative.   HENT: Negative.   Eyes: Positive for double vision.  Respiratory: Negative.   Cardiovascular: Negative.   Gastrointestinal: Negative.   Genitourinary: Negative.   Musculoskeletal: Negative.   Skin: Negative.   Neurological: Positive for tremors.  Endo/Heme/Allergies: Negative.   Psychiatric/Behavioral: Positive for depression and substance abuse. The patient is nervous/anxious.     Blood pressure 114/72, pulse 99, temperature 97.4 F (36.3 C), temperature source Oral, resp. rate 16, height 6' (1.829 m), weight 84.823 kg (187 lb), SpO2 100.00%.Body mass index is 25.36 kg/(m^2).  General Appearance: Fairly Groomed  Patent attorneyye Contact::  Fair  Speech:  Clear and Coherent  Volume:   fluctuates  Mood:  Anxious, Depressed and worried  Affect:  anxious, worried  Thought Process:  Coherent and Goal Directed  Orientation:  Full (Time, Place, and Person)  Thought Content:  events, worries, concerns  Suicidal Thoughts:  Yes.  without intent/plan  Homicidal Thoughts:  No  Memory:  Immediate;   Fair Recent;   Fair Remote;   Fair  Judgement:  Fair  Insight:  Present  Psychomotor Activity:  Restlessness  Concentration:  Fair  Recall:  Fair  Akathisia:  No  Handed:    AIMS (if indicated):     Assets:  Desire for Improvement Social Support  Sleep:  Number of Hours: 5.5   Current Medications: Current Facility-Administered Medications  Medication Dose Route Frequency Provider Last Rate Last Dose  . acetaminophen (TYLENOL) tablet 650 mg  650 mg Oral Q6H PRN Kerry HoughSpencer E Simon, PA-C      . alum & mag hydroxide-simeth (MAALOX/MYLANTA) 200-200-20 MG/5ML suspension 30 mL  30 mL Oral Q4H PRN Kerry HoughSpencer E Simon, PA-C      . buPROPion (WELLBUTRIN XL) 24 hr tablet 150 mg  150 mg Oral Daily Sanjuana KavaAgnes I Nwoko, NP   150 mg at 07/05/13 0913  . gabapentin (NEURONTIN) capsule 200 mg  200 mg Oral TID Rachael FeeIrving A Lugo, MD   200 mg at 07/05/13 1203  . hydrOXYzine (ATARAX/VISTARIL) tablet 25 mg  25 mg Oral Q6H PRN Kerry HoughSpencer E Simon, PA-C   25 mg at 07/02/13 2157  . ibuprofen (ADVIL,MOTRIN) tablet 600 mg  600 mg Oral Q6H PRN Kerry HoughSpencer E Simon, PA-C   600 mg  at 07/05/13 0912  . magnesium hydroxide (MILK OF MAGNESIA) suspension 30 mL  30 mL Oral Daily PRN Kerry Hough, PA-C      . traZODone (DESYREL) tablet 50 mg  50 mg Oral QHS,MR X 1 Kerry Hough, PA-C        Lab Results: No results found for this or any previous visit (from the past 48 hour(s)).  Physical Findings: AIMS: Facial and Oral Movements Muscles of Facial Expression: None, normal Lips and Perioral Area: None, normal Jaw: None, normal Tongue: None, normal,Extremity Movements Upper (arms, wrists, hands, fingers): None, normal Lower  (legs, knees, ankles, toes): None, normal, Trunk Movements Neck, shoulders, hips: None, normal, Overall Severity Severity of abnormal movements (highest score from questions above): None, normal Incapacitation due to abnormal movements: None, normal Patient's awareness of abnormal movements (rate only patient's report): No Awareness, Dental Status Current problems with teeth and/or dentures?: No Does patient usually wear dentures?: No  CIWA:  CIWA-Ar Total: 0 COWS:     Treatment Plan Summary: Daily contact with patient to assess and evaluate symptoms and progress in treatment Medication management  Plan: Review of chart, vital signs, medications, and notes. 1-Individual and group therapy 2-Medication management for depression and anxiety:  Medications reviewed with the patient, denies any adverse effects. 3-Coping skills for depression, anxiety, and substance dependency 4-Continue crisis stabilization and management 5-Address health issues--monitoring vital signs, stable 6-Treatment plan in progress to prevent relapse of depression, substance abuse, and anxiety  Medical Decision Making Problem Points:  Review of psycho-social stressors (1) Data Points:  Review of medication regiment & side effects (2) Review of new medications or change in dosage (2)  I certify that inpatient services furnished can reasonably be expected to improve the patient's condition.   Armandina Stammer I, PMHNP-BC 07/05/2013, 2:46 PM  I have reviewed the note, discussed with the above provider, and agree with the assessment and plan.  Jacqulyn Cane, M.Guzman.  07/05/2013 9:02 PM

## 2013-07-06 MED ORDER — METHOCARBAMOL 500 MG PO TABS
500.0000 mg | ORAL_TABLET | Freq: Three times a day (TID) | ORAL | Status: AC
  Administered 2013-07-06 – 2013-07-08 (×6): 500 mg via ORAL
  Filled 2013-07-06 (×8): qty 1

## 2013-07-06 NOTE — [facility] Group Notes (Signed)
  [facility] LCSW Group Therapy Note   07/06/2013  10:00 To 11:00 AM   Type of Therapy and Topic: Group Therapy: Feelings Around Returning Home & Establishing a Supportive Framework and Activity to Identify signs of Improvement or Decompensation   Participation Level: Appropriate  Mood: Serious  Description of Group:  Patients first processed thoughts and feelings about up coming discharge. These included fears of upcoming changes, lack of change, new living environments, judgements and expectations from others and overall stigma of MH issues. We then discussed what is a supportive framework? What does it look like feel like and how do I discern it from and unhealthy non-supportive network? Learn how to cope when supports are not helpful and don't support you. Discuss what to do when your family/friends are not supportive.   Therapeutic Goals Addressed in Processing Group:  1. Patient will identify one healthy supportive network that they can use at discharge. 2. Patient will identify one factor of a supportive framework and how to tell it from an unhealthy network. 3. Patient able to identify one coping skill to use when they do not have positive supports from others. 4. Patient will demonstrate ability to communicate their needs through discussion and/or role plays.  Summary of Patient Progress:  Pt attentive to all during group session. As patients processed their anxiety about discharge and described healthy supports Termaine appeared to process others remarks.  He shared he is discharging this week and as he has never been to treatment he shared some of his apprehensions. Others in group offered support.   Carney Bernatherine C Sky Borboa, LCSW

## 2013-07-06 NOTE — Progress Notes (Signed)
Patient did attend the evening speaker AA meeting.  

## 2013-07-06 NOTE — Progress Notes (Signed)
Adult Psychoeducational Group Note  Date:  07/06/2013 Time:  0900  Group Topic/Focus:  Relapse Prevention Planning:   The focus of this group is to define relapse and discuss the need for planning to combat relapse.  Participation Level:  Active  Participation Quality:  Attentive, Sharing and Supportive  Affect:  Anxious and Appropriate  Cognitive:  Appropriate  Insight: Improving  Engagement in Group:  Engaged and Supportive  Modes of Intervention:  Discussion, Education and Exploration  Additional Comments:  Pt was engaged and participated in the discussion. He is developing insight into addiction/recovery.  Syrina Wake Shari Prowsvan 07/06/2013, 10:38 AM

## 2013-07-06 NOTE — Progress Notes (Signed)
Pt reported his sleep as fair appetite improving energy normal and ability to pay attention as improving. He rated his depression a 5 hopelessness 3 and his anxiety a 6 on his self-inventory.  He admits to passive S/I no plan to harm self in hospital and contracts to come to staff.  He stated,"I only feel like I am going to hurt myself if they start talking about discharge.  [facility].

## 2013-07-06 NOTE — [facility] Group Notes (Signed)
[facility] Group Notes:  (Nursing/MHT/Case Management/Adjunct)  Date:  07/06/2013  Time:  1530  Type of Therapy:  Nurse Education  Participation Level:  Active  Participation Quality:  Appropriate and Attentive  Affect:  Appropriate  Cognitive:  Appropriate  Insight:  Improving  Engagement in Group:  Developing/Improving  Modes of Intervention:  Exploration  Summary of Progress/Problems:  Cresenciano LickCrissman, Liyla Radliff G 07/06/2013, 4:43 PM

## 2013-07-06 NOTE — Progress Notes (Addendum)
Patient ID: Rickey Guzman, male   DOB: May 11, 1991, 23 y.o.   MRN: [id number] [facility] MD Progress Note  07/06/2013 4:53 PM MICAH BARNIER  MRN:  [id number]  Subjective:  Greysin reports would be doing fairly well today if it has not been his back pain. Says he took some Ibuprofen 600 mg without relief. Says his mood is getting there as expected. Denies any SIHI, AVH.  Diagnosis:   DSM5: Schizophrenia Disorders:  none Obsessive-Compulsive Disorders:  none Trauma-Stressor Disorders:  none Substance/Addictive Disorders:  Cannabis Use Disorder - Moderate 9304.30) and Opioid Disorder - Moderate (304.00) Depressive Disorders:  Major Depressive Disorder - Moderate (296.22)  Axis I: Anxiety Disorder NOS and Panic Disorder  ADL's:  Intact  Sleep: Poor  Appetite:  Fair  Suicidal Ideation:  Plan:  denies Intent:  denies Means:  denies Homicidal Ideation:  Plan:  denies Intent:  denies Means:  denies  AEB (as evidenced by): Per patient's reports.  Psychiatric Specialty Exam: Review of Systems  Constitutional: Negative.   HENT: Negative.   Eyes: Positive for double vision.  Respiratory: Negative.   Cardiovascular: Negative.   Gastrointestinal: Negative.   Genitourinary: Negative.   Musculoskeletal: Negative.   Skin: Negative.   Neurological: Positive for tremors.  Endo/Heme/Allergies: Negative.   Psychiatric/Behavioral: Positive for depression and substance abuse. The patient is nervous/anxious.     Blood pressure 117/78, pulse 77, temperature 97.3 F (36.3 C), temperature source Oral, resp. rate 16, height 6' (1.829 m), weight 84.823 kg (187 lb), SpO2 100.00%.Body mass index is 25.36 kg/(m^2).  General Appearance: Fairly Groomed  Patent attorney::  Fair  Speech:  Clear and Coherent  Volume:  fluctuates  Mood:  Anxious, Depressed and worried  Affect:  anxious, worried  Thought Process:  Coherent and Goal Directed  Orientation:  Full (Time, Place, and Person)  Thought Content:   events, worries, concerns  Suicidal Thoughts:  Yes.  without intent/plan  Homicidal Thoughts:  No  Memory:  Immediate;   Fair Recent;   Fair Remote;   Fair  Judgement:  Fair  Insight:  Present  Psychomotor Activity:  Restlessness  Concentration:  Fair  Recall:  Fair  Akathisia:  No  Handed:    AIMS (if indicated):     Assets:  Desire for Improvement Social Support  Sleep:  Number of Hours: 4   Current Medications: Current Facility-Administered Medications  Medication Dose Route Frequency Provider Last Rate Last Dose  . acetaminophen (TYLENOL) tablet 650 mg  650 mg Oral Q6H PRN Kerry Hough, PA-C      . alum & mag hydroxide-simeth (MAALOX/MYLANTA) 200-200-20 MG/5ML suspension 30 mL  30 mL Oral Q4H PRN Kerry Hough, PA-C      . buPROPion (WELLBUTRIN XL) 24 hr tablet 150 mg  150 mg Oral Daily Sanjuana Kava, NP   150 mg at 07/06/13 0802  . gabapentin (NEURONTIN) capsule 200 mg  200 mg Oral TID Rachael Fee, MD   200 mg at 07/06/13 1255  . hydrOXYzine (ATARAX/VISTARIL) tablet 25 mg  25 mg Oral Q6H PRN Kerry Hough, PA-C   25 mg at 07/02/13 2157  . ibuprofen (ADVIL,MOTRIN) tablet 600 mg  600 mg Oral Q6H PRN Kerry Hough, PA-C   600 mg at 07/06/13 1512  . magnesium hydroxide (MILK OF MAGNESIA) suspension 30 mL  30 mL Oral Daily PRN Kerry Hough, PA-C      . methocarbamol (ROBAXIN) tablet 500 mg  500 mg Oral TID Nicole Kindred  I Nwoko, NP   500 mg at 07/06/13 1512  . traZODone (DESYREL) tablet 50 mg  50 mg Oral QHS,MR X 1 Kerry HoughSpencer E Simon, PA-C        Lab Results: No results found for this or any previous visit (from the past 48 hour(s)).  Physical Findings: AIMS: Facial and Oral Movements Muscles of Facial Expression: None, normal Lips and Perioral Area: None, normal Jaw: None, normal Tongue: None, normal,Extremity Movements Upper (arms, wrists, hands, fingers): None, normal Lower (legs, knees, ankles, toes): None, normal, Trunk Movements Neck, shoulders, hips: None, normal,  Overall Severity Severity of abnormal movements (highest score from questions above): None, normal Incapacitation due to abnormal movements: None, normal Patient's awareness of abnormal movements (rate only patient's report): No Awareness, Dental Status Current problems with teeth and/or dentures?: No Does patient usually wear dentures?: No  CIWA:  CIWA-Ar Total: 0 COWS:     Treatment Plan Summary: Daily contact with patient to assess and evaluate symptoms and progress in treatment Medication management  Plan: Review of chart, vital signs, medications, and notes. 1-Individual and group therapy 2-Medication management for depression and anxiety:  Medications reviewed with the patient, denies any adverse effects. 3-Coping skills for depression, anxiety, and substance dependency 4-Continue crisis stabilization and management 5-Address health issues--monitoring vital signs, (a). Robaxin 500 mg tid x 2 days for back pains. 6-Treatment plan in progress to prevent relapse of depression, substance abuse, and anxiety  Medical Decision Making Problem Points:  Review of psycho-social stressors (1) Data Points:  Review of medication regiment & side effects (2) Review of new medications or change in dosage (2)  I certify that inpatient services furnished can reasonably be expected to improve the patient's condition.   Armandina Stammerwoko, Agnes I, PMHNP-BC 07/06/2013, 4:53 PM  I have reviewed the note, discussed with the above provider,  and agree with the above findings and plan with the following exceptions.  Jacqulyn CaneSHAJI Jazmeen Axtell, M.D.  07/06/2013 10:14 PM

## 2013-07-06 NOTE — Progress Notes (Signed)
Adult Psychoeducational Group Note  Date:  07/06/2013 Time:  1:15PM  Group Topic/Focus:  Healthy Support Systems  Participation Level:  Active  Participation Quality:  Appropriate, attentive, and sharing  Affect:  Appropriate  Cognitive:  Appropriate  Insight: Appropriate  Engagement in Group:  Engaged  Modes of Intervention:  Discussion  Additional Comments:  The group session today focused on healthy support systems.  Staff asked Pt to identify their support system and indicate how they can become a better support to themselves. Pt indicated that his grandfather and aunt were his supports and indicated that he could become a better support to himself by finding new things to do.   Rickey Guzman, Kamiya Acord R 07/06/2013, 3:53 PM

## 2013-07-06 NOTE — Progress Notes (Addendum)
D.  Pt pleasant on approach, no complaints voiced other than chronic back/hip pain.  Positive for evening AA group, interacting appropriately within milieu.  Denies SI/HI/hallucinations at this time.  A.  Support and encouragement offered, medication given as ordered for pain  R.  Pt remains safe on unit, in dayroom watching TV with peers in no acute distress,  will continue to [employer]

## 2013-07-07 MED ORDER — NICOTINE POLACRILEX 2 MG MT GUM
2.0000 mg | CHEWING_GUM | OROMUCOSAL | Status: DC | PRN
  Administered 2013-07-07 – 2013-07-08 (×3): 2 mg via ORAL
  Filled 2013-07-07 (×3): qty 1

## 2013-07-07 MED ORDER — GABAPENTIN 300 MG PO CAPS
300.0000 mg | ORAL_CAPSULE | Freq: Three times a day (TID) | ORAL | Status: DC
  Administered 2013-07-07 – 2013-07-08 (×3): 300 mg via ORAL
  Filled 2013-07-07 (×6): qty 1

## 2013-07-07 NOTE — Progress Notes (Signed)
Adult Psychoeducational Group Note  Date:  07/07/2013 Time:  2000  Group Topic/Focus:  AA  Participation Level:  Minimal  Participation Quality:  Appropriate  Affect:  Appropriate  Cognitive:  Appropriate  Insight: Appropriate  Engagement in Group:  Improving  Modes of Intervention:  Discussion and Education  Additional Comments:    Humberto SealsWhitaker, Aislyn Hayse Monique 07/07/2013, 11:28 PM

## 2013-07-07 NOTE — [facility] Group Notes (Signed)
[facility] LCSW Group Therapy  07/07/2013 3:13 PM  Type of Therapy:  Group Therapy  Participation Level:  Minimal  Participation Quality:  Attentive  Affect:  Depressed  Cognitive:  Oriented  Insight:  Limited  Engagement in Therapy:  Limited  Modes of Intervention:  Confrontation, Discussion, Education, Exploration, Problem-solving, Rapport Building, Socialization and Support  Summary of Progress/Problems: Today's Topic: Overcoming Obstacles. Pt identified obstacles faced currently and processed barriers involved in overcoming these obstacles. Pt identified steps necessary for overcoming these obstacles and explored motivation (internal and external) for facing these difficulties head on. Pt further identified one area of concern in their lives and chose a skill of focus pulled from their "toolbox." [name] was attentive and engaged throughout today's therapy group. He reported that he was upset because his [facility] was not [facility]. [name] also stated that his mind was racing due to problems with legal issues and his Engineer, drillingrobation officer. [name] shows limited insight AEB his inability to process how focusing on himself rather than others will benefit him in his recovery and regarding his legal troubles. [name] stated that he was [facility] and was not sure if this was what he wanted to do. [name] was able to problem solve and brainstorm options with CSW, demonstrating some progress in the group setting.    Smart, Venba Zenner LCSWA  07/07/2013, 3:13 PM

## 2013-07-07 NOTE — Clinical Social Work Note (Signed)
Per pt request, MD note faxed to his attorney, Katina DegreeScott Colter at [phone]. Fax and confirmation sheet given to pt for his personal records.  [employer] Sherwin-WilliamsHeather Smart, LCSWA 07/07/2013 11:35 AM

## 2013-07-07 NOTE — Progress Notes (Signed)
D:  Pt did not fill out a self inventory sheet, states that he slept well, denies SI/Hi/AVH, bright during interaction, pt went to gym and enjoyed the time to exercise.  A:  Emotional support provided, Encouraged pt to continue with treatment plan and attend all group activities, q15 min checks maintained for safety.  R:  Pt is cooperative with staff and other patients, going to some groups.

## 2013-07-07 NOTE — Progress Notes (Signed)
Recreation Therapy Notes  Date: 01.26.2015 Time: 2:45pm Location: [address] Dayroom   Group Topic: Wellness  Goal Area(s) Addresses:  Patient will identify dimension of wellness they most struggle with.  Patient will identify at least 2 ways to invest in that type of wellness.   Behavioral Response: Distracted, but redirectable.   Intervention: Informational worksheet  Activity: Patients were provided a worksheet outlining the 6 dimensions of wellness - Physical, Emotional, Social, Emotional, Environmental, & Spiritual.  As a group patients were asked to identify examples of each dimension, individually patients were asked to identify at least 2 things they can do post d/c to invest in each dimension of wellness.   Education: Wellness, Discharge Planning  Education Outcome: Acknowledges understanding   Clinical Observations/Feedback: Patient attended group session, participated in identifying examples of each dimension of wellness, but did not complete individual portion of activity. Patient made no contributions to group discussion.   Patient and peers (1 male, 1 male) needed prompt to stop side conversation. Patient redirected, but never fully engaged in group session.     Marykay Lexenise L Patsey Pitstick, LRT/CTRS  Jearl KlinefelterBlanchfield, Nadalyn Deringer L 07/07/2013 4:33 PM

## 2013-07-07 NOTE — Tx Team (Signed)
Interdisciplinary Treatment Plan Update (Adult)  Date: 07/07/2013   Time Reviewed: 11:01 AM  Progress in Treatment:  Attending groups: Yes  Participating in groups:  Yes  Taking medication as prescribed: Yes  Tolerating medication: Yes  Family/Significant othe contact made:SPE completed with pt's grandfather.  Patient understands diagnosis: Yes, AEB seeking treatment for substance abuse and Mood stabilization.  Discussing patient identified problems/goals with staff: Yes  Medical problems stabilized or resolved: Yes  Denies suicidal/homicidal ideation: Yes during group/self report.  Patient has not harmed self or Others: Yes  New problem(s) identified:  Discharge Plan or Barriers: [facility]. [facility]. He plans to d/c to his aunt's home [facility].  Additional comments:  Reason for Continuation of Hospitalization: Mood stabilization Medication management  Estimated length of stay: 1-2 days likely d/c Tuesday.  For review of initial/current patient goals, please see plan of care.  Attendees:  Patient:    Family:    Physician: Dr. Dub MikesLugo MD 07/07/2013 11:01 AM   Nursing: Wynona Caneshristine RN  07/07/2013 11:01 AM   Clinical Social Worker Skylan Lara Smart, LCSWA  07/07/2013 11:01 AM   Other: Tomasita Morrowelora Sutton Care Coordination   07/07/2013 11:01 AM   Other:    Other: Darden DatesJennifer C. Nurse CM 07/07/2013 11:01 AM   Other:    Scribe for Treatment Team:  [employer]-WilliamsHeather Smart LCSWA 07/07/2013 11:01 AM

## 2013-07-07 NOTE — Progress Notes (Signed)
D: Pt was pleasant and cooperative with staff this evening. Pt is endorsing anxiety this evening. He is having anxiety about not knowing his exact plans for discharge. He is patiently waiting for everything with [facility] to be cleared. He is looking forward to going there. This pt has been observed with active participation within the milieu. Pt attended group this evening. Pt is currently denying any SI. However, pt contracts for safety in relation to recently being positive for SI.  A: Writer administered scheduled and prn medications to pt. Continued support and availability as needed was extended to this pt. Staff continue to monitor pt with q6815min checks.  R: No adverse drug reactions noted. Pt receptive to treatment. Pt remains safe at this time.

## 2013-07-07 NOTE — Progress Notes (Signed)
[facility]  07/07/2013 2:39 PM [name]  MRN:  [id number] Subjective:  States that he experiences increased anxiety building up to panic when thinking about going back to jail. He wants to be able to deal with his substance abuse by going to rehab. Concerned about how he is going to handle being locked in jail for the 30 days if his probation officer would push for it. Diagnosis:   DSM5: Schizophrenia Disorders:  none Obsessive-Compulsive Disorders:  none Trauma-Stressor Disorders:  none Substance/Addictive Disorders:  Cannabis Use Disorder - Moderate 9304.30) and Opioid Disorder - Moderate (304.00) Depressive Disorders:  Major Depressive Disorder - Moderate (296.22)  Axis I: Anxiety Disorder NOS and Panic Disorder  ADL's:  Intact  Sleep: Poor  Appetite:  Fair  Suicidal Ideation:  Plan:  denies Intent:  denies Means:  denies Homicidal Ideation:  Plan:  denies Intent:  denies Means:  denies AEB (as evidenced by):  Psychiatric Specialty Exam: Review of Systems  Constitutional: Negative.   HENT: Negative.   Eyes: Negative.   Respiratory: Negative.   Cardiovascular: Negative.   Gastrointestinal: Negative.   Genitourinary: Negative.   Musculoskeletal: Positive for back pain.  Skin: Negative.   Neurological: Negative.   Endo/Heme/Allergies: Negative.   Psychiatric/Behavioral: Positive for depression and substance abuse. The patient is nervous/anxious and has insomnia.     Blood pressure 155/84, pulse 112, temperature 98 F (36.7 C), temperature source Oral, resp. rate 16, height 6' (1.829 m), weight 84.823 kg (187 lb), SpO2 100.00%.Body mass index is 25.36 kg/(m^2).  General Appearance: Fairly Groomed  Patent attorneyye Contact::  Fair  Speech:  Clear and Coherent  Volume:  fluctuates  Mood:  Anxious, Depressed and worried  Affect:  sad, anxious, worried  Thought Process:  Coherent and Goal Directed  Orientation:  Full (Time, Place, and Person)  Thought Content:   symptoms, worries, concerns  Suicidal Thoughts:  intermittent  Homicidal Thoughts:  No  Memory:  Immediate;   Fair Recent;   Fair Remote;   Fair  Judgement:  Fair  Insight:  Present  Psychomotor Activity:  Restlessness  Concentration:  Fair  Recall:  Fair  Akathisia:  No  Handed:    AIMS (if indicated):     Assets:  Desire for Improvement  Sleep:  Number of Hours: 5   Current Medications: Current Facility-Administered Medications  Medication Dose Route Frequency Provider Last Rate Last Dose  . acetaminophen (TYLENOL) tablet 650 mg  650 mg Oral Q6H PRN Kerry HoughSpencer E Simon, PA-C      . alum & mag hydroxide-simeth (MAALOX/MYLANTA) 200-200-20 MG/5ML suspension 30 mL  30 mL Oral Q4H PRN Kerry HoughSpencer E Simon, PA-C      . buPROPion (WELLBUTRIN XL) 24 hr tablet 150 mg  150 mg Oral Daily Sanjuana KavaAgnes I Nwoko, NP   150 mg at 07/07/13 0908  . gabapentin (NEURONTIN) capsule 300 mg  300 mg Oral TID Rachael FeeIrving A Marinus Eicher, MD   300 mg at 07/07/13 1020  . hydrOXYzine (ATARAX/VISTARIL) tablet 25 mg  25 mg Oral Q6H PRN Kerry HoughSpencer E Simon, PA-C   25 mg at 07/02/13 2157  . ibuprofen (ADVIL,MOTRIN) tablet 600 mg  600 mg Oral Q6H PRN Kerry HoughSpencer E Simon, PA-C   600 mg at 07/06/13 2116  . magnesium hydroxide (MILK OF MAGNESIA) suspension 30 mL  30 mL Oral Daily PRN Kerry HoughSpencer E Simon, PA-C      . methocarbamol (ROBAXIN) tablet 500 mg  500 mg Oral TID Sanjuana KavaAgnes I Nwoko, NP  500 mg at 07/07/13 1149  . traZODone (DESYREL) tablet 50 mg  50 mg Oral QHS,MR X 1 Kerry Hough, PA-C        Lab Results: No results found for this or any previous visit (from the past 48 hour(s)).  Physical Findings: AIMS: Facial and Oral Movements Muscles of Facial Expression: None, normal Lips and Perioral Area: None, normal Jaw: None, normal Tongue: None, normal,Extremity Movements Upper (arms, wrists, hands, fingers): None, normal Lower (legs, knees, ankles, toes): None, normal, Trunk Movements Neck, shoulders, hips: None, normal, Overall Severity Severity  of abnormal movements (highest score from questions above): None, normal Incapacitation due to abnormal movements: None, normal Patient's awareness of abnormal movements (rate only patient's report): No Awareness, Dental Status Current problems with teeth and/or dentures?: No Does patient usually wear dentures?: No  CIWA:  CIWA-Ar Total: 0 COWS:     Treatment Plan Summary: Daily contact with patient to assess and evaluate symptoms and progress in treatment Medication management  Plan: Supportive approach/coping skills/relapse prevention           Reassess and address the co morbidities           CBT;mindfulness           Increase the Neurontin to 300 mg TID Medical Decision Making Problem Points:  Review of psycho-social stressors (1) Data Points:  Review of medication regiment & side effects (2)  I certify that inpatient services furnished can reasonably be expected to improve the patient's condition.   Samarth Ogle A 07/07/2013, 2:39 PM

## 2013-07-07 NOTE — [facility] Group Notes (Signed)
[facility]   07/07/2013 9:49 AM  Participation Quality:  Did not attend-pt refused to get out of bed and come to group.   Smart, American FinancialHeather LCSWA

## 2013-07-08 MED ORDER — HYDROXYZINE HCL 25 MG PO TABS
25.0000 mg | ORAL_TABLET | Freq: Three times a day (TID) | ORAL | Status: DC
  Filled 2013-07-08 (×2): qty 42

## 2013-07-08 MED ORDER — SERTRALINE HCL 25 MG PO TABS: 25.0000 mg | ORAL_TABLET | Freq: Every day | ORAL | Status: DC

## 2013-07-08 MED ORDER — GABAPENTIN 400 MG PO CAPS: 400.0000 mg | ORAL_CAPSULE | Freq: Three times a day (TID) | ORAL | Status: AC

## 2013-07-08 MED ORDER — HYDROXYZINE HCL 25 MG PO TABS: ORAL_TABLET | ORAL | Status: DC

## 2013-07-08 MED ORDER — BUPROPION HCL ER (XL) 300 MG PO TB24: 300.0000 mg | ORAL_TABLET | Freq: Every day | ORAL | Status: AC

## 2013-07-08 MED ORDER — HYDROXYZINE HCL 25 MG PO TABS: 25.0000 mg | ORAL_TABLET | Freq: Four times a day (QID) | ORAL | Status: DC | PRN

## 2013-07-08 MED ORDER — BUPROPION HCL ER (XL) 300 MG PO TB24
300.0000 mg | ORAL_TABLET | Freq: Every day | ORAL | Status: DC
  Filled 2013-07-08 (×2): qty 14

## 2013-07-08 MED ORDER — GABAPENTIN 400 MG PO CAPS
400.0000 mg | ORAL_CAPSULE | Freq: Three times a day (TID) | ORAL | Status: DC
  Administered 2013-07-08 (×2): 400 mg via ORAL
  Filled 2013-07-08: qty 42
  Filled 2013-07-08: qty 1
  Filled 2013-07-08 (×2): qty 42
  Filled 2013-07-08 (×3): qty 1

## 2013-07-08 MED ORDER — GABAPENTIN 400 MG PO CAPS
400.0000 mg | ORAL_CAPSULE | Freq: Three times a day (TID) | ORAL | Status: DC
  Filled 2013-07-08 (×2): qty 1

## 2013-07-08 MED ORDER — SERTRALINE HCL 25 MG PO TABS
25.0000 mg | ORAL_TABLET | Freq: Every day | ORAL | Status: DC
  Filled 2013-07-08: qty 14

## 2013-07-08 MED ORDER — BUPROPION HCL ER (XL) 300 MG PO TB24
300.0000 mg | ORAL_TABLET | Freq: Every day | ORAL | Status: DC
  Filled 2013-07-08: qty 1

## 2013-07-08 MED ORDER — SERTRALINE HCL 25 MG PO TABS
25.0000 mg | ORAL_TABLET | Freq: Every day | ORAL | Status: DC
  Administered 2013-07-08: 25 mg via ORAL
  Filled 2013-07-08 (×4): qty 1

## 2013-07-08 NOTE — Progress Notes (Signed)
[facility] :  Will you be returning to the same living situation after discharge: No.[facility]  At discharge, do you have transportation home?:Yes,  grandfather will pick up pt at 7am Wed morning.  Do you have the ability to pay for your medications:Yes,  mental health'  Release of information consent forms completed and  Submitted to Medical Records by CSW.   Patient to Follow up at: Follow-up Information   Follow up with [facility]. (Walk in between 8am-9am Monday through Friday for hospital followup/medication management. )    Contact information:   [address] [place], [place] [address] Phone: [phone] Fax: [phone]      Follow up with [facility] On 07/14/2013. (Arrive on this date at 8:00 am promptly for intake screening and possible admission for 28 day inpatient program.  Bring belongings, supply of meds and [facility] [place] [facility] [place].  )    Contact information:   [address] [place], [place] [address] Phone: [phone] Fax: [phone]      Patient denies SI/HI:   Yes,  during group/self report.    Safety Planning and Suicide Prevention discussed:  Yes,  SPE completed with pt's grandfather. SPI pamphlet provided to pt and he was encouraged to share information with support network, ask questions, and talk about any concerns relating to SPE.   Smart, Evelina Lore LCSWA  07/08/2013, 3:29 PM

## 2013-07-08 NOTE — Progress Notes (Signed)
Adult Psychoeducational Group Note  Date:  07/08/2013 Time:  2000   Group Topic/Focus:  AA/NA  Participation Level:  Minimal  Participation Quality:  Inattentive  Affect:  Appropriate  Cognitive:  Appropriate  Insight: None  Engagement in Group:  None  Modes of Intervention:  Discussion and Education  Additional Comments:    Humberto SealsWhitaker, Lue Sykora Monique 07/08/2013, 10:26 PM

## 2013-07-08 NOTE — BHH Suicide Risk Assessment (Signed)
Suicide Risk Assessment  Discharge Assessment     Demographic Factors:  Male, Adolescent or young adult and Caucasian  Mental Status Per Nursing Assessment::   On Admission:  Suicidal ideation indicated by patient  Current Mental Status by Physician: in full contact with reality. Mood anxious, worried, affect is appropriate. There are no suicidal ideas plans, or intent. There are no active S/S of withdrawal. He is motivated to continue to work on his addiction. [facility] and further pursue treatment for his anxiety   Loss Factors: Legal issues  Historical Factors: NA  Risk Reduction Factors:   Sense of responsibility to family and Positive social support  Continued Clinical Symptoms:  Depression:   Comorbid alcohol abuse/dependence Alcohol/Substance Abuse/Dependencies  Cognitive Features That Contribute To Risk:  Closed-mindedness Polarized thinking Thought constriction (tunnel vision)    Suicide Risk:  Minimal: No identifiable suicidal ideation.  Patients presenting with no risk factors but with morbid ruminations; may be classified as minimal risk based on the severity of the depressive symptoms  Discharge Diagnoses:   AXIS I:  Cannabis, Opioid Abuse, Anxiety Disorder NOS, MDD AXIS II:  Deferred AXIS III:   Past Medical History  Diagnosis Date  . Depression    AXIS IV:  problems related to legal system/crime AXIS V:  61-70 mild symptoms  Plan Of Care/Follow-up recommendations:  Activity:  as tolerated Diet:  regular [facility] Is patient on multiple antipsychotic therapies at discharge:  No   Has Patient had three or more failed trials of antipsychotic monotherapy by history:  No  Recommended Plan for Multiple Antipsychotic Therapies: NA  Barrett Goldie A 07/08/2013, 6:26 PM

## 2013-07-08 NOTE — Progress Notes (Signed)
[facility] MD Progress Note  07/08/2013 6:13 PM Rickey Guzman  MRN:  [id number] Subjective:  Rickey Guzman is dealing with a lot of anxiety. He continues to experience the underlying depression. There is a lot of uncertainty in terms of what is going to happen. Afraid that if he does not go straight to rehab his PO will try to lock him up for the 30 days. States he needs help with his addiction as well as his depression and anxiety. States he felt he was better but last evening  he " lost it." Admits he did not sleep worrying afraid he is going back to jail Diagnosis:   DSM5: Schizophrenia Disorders:  none Obsessive-Compulsive Disorders:  none Trauma-Stressor Disorders:  None Substance/Addictive Disorders:  Cannabis Use Disorder - Moderate 9304.30) and Opioid Disorder - Moderate (304.00) Depressive Disorders:  Major Depressive Disorder - Moderate (296.22)  Axis I: Anxiety Disorder NOS and Substance Induced Mood Disorder  ADL's:  Intact  Sleep: Fair  Appetite:  Fair  Suicidal Ideation:  Plan:  denies Intent:  denies Means:  denies Homicidal Ideation:  Plan:  denies Intent:  denies Means:  denies AEB (as evidenced by):  Psychiatric Specialty Exam: Review of Systems  Constitutional: Negative.   HENT: Negative.   Eyes: Negative.   Respiratory: Negative.   Cardiovascular: Negative.   Gastrointestinal: Negative.   Genitourinary: Negative.   Musculoskeletal: Negative.   Skin: Negative.   Neurological: Negative.   Endo/Heme/Allergies: Negative.   Psychiatric/Behavioral: Positive for depression and substance abuse. The patient is nervous/anxious and has insomnia.     Blood pressure 120/68, pulse 76, temperature 98 F (36.7 C), temperature source Oral, resp. rate 18, height 6' (1.829 m), weight 84.823 kg (187 lb), SpO2 100.00%.Body mass index is 25.36 kg/(m^2).  General Appearance: Fairly Groomed  Patent attorney::  Fair  Speech:  Clear and Coherent  Volume:  Decreased  Mood:  Anxious and  sad, worried  Affect:  anxious, worried  Thought Process:  Coherent and Goal Directed  Orientation:  Full (Time, Place, and Person)  Thought Content:  symptoms, worries, concerns  Suicidal Thoughts:  No  Homicidal Thoughts:  No  Memory:  Immediate;   Fair Recent;   Fair Remote;   Fair  Judgement:  Fair  Insight:  Present  Psychomotor Activity:  Restlessness  Concentration:  Fair  Recall:  Fair  Akathisia:  No  Handed:    AIMS (if indicated):     Assets:  Desire for Improvement  Sleep:  Number of Hours: 4.5   Current Medications: Current Facility-Administered Medications  Medication Dose Route Frequency Provider Last Rate Last Dose  . acetaminophen (TYLENOL) tablet 650 mg  650 mg Oral Q6H PRN Kerry Hough, PA-C      . alum & mag hydroxide-simeth (MAALOX/MYLANTA) 200-200-20 MG/5ML suspension 30 mL  30 mL Oral Q4H PRN Kerry Hough, PA-C      . [START ON 07/09/2013] buPROPion (WELLBUTRIN XL) 24 hr tablet 300 mg  300 mg Oral Daily Rachael Fee, MD      . gabapentin (NEURONTIN) capsule 400 mg  400 mg Oral TID Rachael Fee, MD   400 mg at 07/08/13 1659  . hydrOXYzine (ATARAX/VISTARIL) tablet 25 mg  25 mg Oral Q6H PRN Kerry Hough, PA-C   25 mg at 07/07/13 2135  . ibuprofen (ADVIL,MOTRIN) tablet 600 mg  600 mg Oral Q6H PRN Kerry Hough, PA-C   600 mg at 07/07/13 2135  . magnesium hydroxide (MILK OF MAGNESIA)  suspension 30 mL  30 mL Oral Daily PRN Kerry HoughSpencer E Simon, PA-C      . nicotine polacrilex (NICORETTE) gum 2 mg  2 mg Oral PRN Rachael FeeIrving A Ammiel Guiney, MD   2 mg at 07/08/13 1547  . sertraline (ZOLOFT) tablet 25 mg  25 mg Oral Daily Rachael FeeIrving A Kelcey Korus, MD   25 mg at 07/08/13 1120    Lab Results: No results found for this or any previous visit (from the past 48 hour(s)).  Physical Findings: AIMS: Facial and Oral Movements Muscles of Facial Expression: None, normal Lips and Perioral Area: None, normal Jaw: None, normal Tongue: None, normal,Extremity Movements Upper (arms, wrists,  hands, fingers): None, normal Lower (legs, knees, ankles, toes): None, normal, Trunk Movements Neck, shoulders, hips: None, normal, Overall Severity Severity of abnormal movements (highest score from questions above): None, normal Incapacitation due to abnormal movements: None, normal Patient's awareness of abnormal movements (rate only patient's report): No Awareness, Dental Status Current problems with teeth and/or dentures?: No Does patient usually wear dentures?: No  CIWA:  CIWA-Ar Total: 0 COWS:  COWS Total Score: 0  Treatment Plan Summary: Daily contact with patient to assess and evaluate symptoms and progress in treatment Medication management  Plan: Supportive approach/coping skills/relapse prevention/           CBT;mindfulness, stress management           Optimize treatment with psychotropics           Will increase the Wellbutrin to 300 mg and will add Zoloft 25 mg to augment and help with the anxiety          Increase the Neuronin to 400 mg TID   Medical Decision Making Problem Points:  Review of psycho-social stressors (1) Data Points:  Review of medication regiment & side effects (2) Review of new medications or change in dosage (2)  I certify that inpatient services furnished can reasonably be expected to improve the patient's condition.   Cleveland Yarbro A 07/08/2013, 6:13 PM

## 2013-07-08 NOTE — Progress Notes (Signed)
Recreation Therapy Notes  Animal-Assisted Activity/Therapy (AAA/T) Program Checklist/Progress Notes Patient Eligibility Criteria Checklist & Daily Group note for Rec Tx Intervention  Date: 01.27.2015 Time: 2:45pm [address]    AAA/T Program Assumption of Risk Form signed by Patient/ or Parent Legal Guardian yes  Patient is free of allergies or sever asthma yes  Patient reports no fear of animals yes  Patient reports no history of cruelty to animals yes   Patient understands his/her participation is voluntary yes  Patient washes hands before animal contact yes  Patient washes hands after animal contact yes  Behavioral Response: Engaged, Appropriate   Education: Hand Washing, Appropriate Animal Interaction   Education Outcome: Acknowledges understanding   Clinical Observations/Feedback: Patient interacted appropriately with therapeutic dog team, peers and LRT.   Marykay Lexenise L Jimi Giza, LRT/CTRS  Jearl KlinefelterBlanchfield, Laneta Guerin L 07/08/2013 5:32 PM

## 2013-07-08 NOTE — BHH Group Notes (Signed)
BHH LCSW Group Therapy  07/08/2013 1:41 PM  Type of Therapy:  Group Therapy  Participation Level:  Active  Participation Quality:  Attentive  Affect:  Appropriate  Cognitive:  Alert and Oriented  Insight:  Improving  Engagement in Therapy:  Engaged  Modes of Intervention:  Confrontation, Discussion, Education, Problem-solving, Rapport Building, Socialization and Support  Summary of Progress/Problems: MHA Speaker came to talk about his personal journey with substance abuse and addiction. The pt processed ways by which to relate to the speaker. MHA speaker provided handouts and educational information pertaining to groups and services offered by [facility]. Rickey Guzman was attentive and engaged throughout today's therapy group. He actively listened as speaker shared experiences with MI and SA. Pt continues to demonstrate progress in the group setting AEB his ability to actively listen and participate in group discussion.   Smart, [name] LCSWA 07/08/2013, 1:41 PM

## 2013-07-08 NOTE — Progress Notes (Signed)
D: Pt denies SI/HI/AVH. Pt is pleasant and cooperative. Pt stated he was still a little worried, but is ready to leave.   A: Pt was offered support and encouragement. Pt was given scheduled medications. Pt was encourage to attend groups. Q 15 minute checks were done for safety.   R:Pt attends groups and interacts well with peers and staff. Pt is taking medication. Pt has no complaints at this time.Pt receptive to treatment and safety maintained on unit.

## 2013-07-08 NOTE — Clinical Social Work Note (Signed)
Pt and CSW spoke at length regarding tomorrow's discharge instructions. Pt's grandfather will pick up Jhoan at approximately 7am and [facility]. Pt was provided with letter from CSW per request, information to [facility] for individual therapy per request, and [facility]. All info in chart. 14 day med supply in chart. Staff aware of pt's early d/c.  The Sherwin-WilliamsHeather Smart, LCSWA  07/08/2013 4:22 PM

## 2013-07-08 NOTE — Progress Notes (Signed)
Patient ID: Rickey Guzman, male   DOB: 1990-11-01, 23 y.o.   MRN: [id number]  D: Patient pleasant and cooperative with care and progressively interacting with staff/peers throughout the day. Pt complaint with medications, but did not attend group session. A: Q 15 minute safety checks, encourage group participation and staff/peer interaction. Administer medications as ordered by MD. R: Pt denies SI or plans to harm himself. No needs at this time.

## 2013-07-09 NOTE — Progress Notes (Signed)
Nurs Dischg Note:  D:Patient denies SI/HI/AVH at this time. Pt appears calm and cooperative, and no distress noted. Pt escorted to lobby where grandfather met him to take him to his destination.   A: All Personal items in locker returned to pt. Pt escorted out of the building.  R:  Pt States she will comply with outpatient services, and take MEDS as prescribed. 
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
## 2013-07-14 NOTE — Progress Notes (Signed)
Patient Discharge Instructions:  After Visit Summary (AVS):   Faxed to:  07/14/13 Discharge Summary Note:   Faxed to:  07/14/13 Psychiatric Admission Assessment Note:   Faxed to:  07/14/13 Suicide Risk Assessment - Discharge Assessment:   Faxed to:  07/14/13 Faxed/Sent to the Next Level Care provider:  07/14/13 [facility]axed to [facility] @ [phone] [facility] @ [phone]  Jerelene ReddenSheena E Woodland, 07/14/2013, 3:43 PM

## 2017-03-20 ENCOUNTER — Encounter: Payer: Self-pay | Admitting: Family Medicine

## 2017-03-20 ENCOUNTER — Ambulatory Visit (HOSPITAL_COMMUNITY)
Admission: RE | Admit: 2017-03-20 | Discharge: 2017-03-20 | Disposition: A | Discharge status: Home / Self Care | Payer: Self-pay | Source: Ambulatory Visit | Attending: Family Medicine | Admitting: Family Medicine

## 2017-03-20 ENCOUNTER — Ambulatory Visit (INDEPENDENT_AMBULATORY_CARE_PROVIDER_SITE_OTHER): Payer: Self-pay | Admitting: Family Medicine

## 2017-03-20 ENCOUNTER — Telehealth: Payer: Self-pay | Admitting: Family Medicine

## 2017-03-20 VITALS — BP 119/86 | HR 60 | Temp 98.6°F | Resp 16 | Ht 72.0 in | Wt 197.0 lb

## 2017-03-20 DIAGNOSIS — R109 Unspecified abdominal pain: Secondary | ICD-10-CM

## 2017-03-20 DIAGNOSIS — R079 Chest pain, unspecified: Secondary | ICD-10-CM

## 2017-03-20 DIAGNOSIS — R1084 Generalized abdominal pain: Secondary | ICD-10-CM

## 2017-03-20 DIAGNOSIS — K529 Noninfective gastroenteritis and colitis, unspecified: Secondary | ICD-10-CM

## 2017-03-20 DIAGNOSIS — F411 Generalized anxiety disorder: Secondary | ICD-10-CM

## 2017-03-20 DIAGNOSIS — R9389 Abnormal findings on diagnostic imaging of other specified body structures: Secondary | ICD-10-CM | POA: Insufficient documentation

## 2017-03-20 LAB — CBC WITH DIFFERENTIAL/PLATELET
Basophils Absolute: 20 cells/uL (ref 0–200)
Basophils Relative: 0.4 %
Eosinophils Absolute: 108 cells/uL (ref 15–500)
Eosinophils Relative: 2.2 %
HEMATOCRIT: 42.8 % (ref 38.5–50.0)
Hemoglobin: 14.3 g/dL (ref 13.2–17.1)
LYMPHS ABS: 1372 {cells}/uL (ref 850–3900)
MCH: 28.9 pg (ref 27.0–33.0)
MCHC: 33.4 g/dL (ref 32.0–36.0)
MCV: 86.5 fL (ref 80.0–100.0)
MPV: 10.6 fL (ref 7.5–12.5)
Monocytes Relative: 6.9 %
Neutro Abs: 3063 cells/uL (ref 1500–7800)
Neutrophils Relative %: 62.5 %
Platelets: 244 10*3/uL (ref 140–400)
RBC: 4.95 10*6/uL (ref 4.20–5.80)
RDW: 12.6 % (ref 11.0–15.0)
Total Lymphocyte: 28 %
WBC mixed population: 338 cells/uL (ref 200–950)
WBC: 4.9 10*3/uL (ref 3.8–10.8)

## 2017-03-20 LAB — POCT URINALYSIS DIP (DEVICE)
Bilirubin Urine: NEGATIVE
Glucose, UA: NEGATIVE mg/dL
HGB URINE DIPSTICK: NEGATIVE
Ketones, ur: NEGATIVE mg/dL
Leukocytes, UA: NEGATIVE
Nitrite: NEGATIVE
Protein, ur: NEGATIVE mg/dL
Specific Gravity, Urine: 1.02 (ref 1.005–1.030)
Urobilinogen, UA: 0.2 mg/dL (ref 0.0–1.0)
pH: >=9 (ref 5.0–8.0)

## 2017-03-20 LAB — BASIC METABOLIC PANEL
BUN: 10 mg/dL (ref 7–25)
CO2: 23 mmol/L (ref 20–32)
CREATININE: 0.88 mg/dL (ref 0.60–1.35)
Calcium: 9.7 mg/dL (ref 8.6–10.3)
Chloride: 106 mmol/L (ref 98–110)
Glucose, Bld: 95 mg/dL (ref 65–99)
POTASSIUM: 4.1 mmol/L (ref 3.5–5.3)
Sodium: 138 mmol/L (ref 135–146)

## 2017-03-20 LAB — LIPASE: Lipase: 13 U/L (ref 7–60)

## 2017-03-20 LAB — AMYLASE: AMYLASE: 28 U/L (ref 21–101)

## 2017-03-20 MED ORDER — HYDROXYZINE HCL 25 MG PO TABS: ORAL_TABLET | ORAL | 1 refills | Status: AC

## 2017-03-20 MED ORDER — OMEPRAZOLE 20 MG PO CPDR: 20.0000 mg | DELAYED_RELEASE_CAPSULE | Freq: Every day | ORAL | 3 refills | Status: AC

## 2017-03-20 MED ORDER — SERTRALINE HCL 50 MG PO TABS: 50.0000 mg | ORAL_TABLET | Freq: Every day | ORAL | 0 refills | Status: AC

## 2017-03-20 MED ORDER — DICYCLOMINE HCL 10 MG PO CAPS
10.0000 mg | ORAL_CAPSULE | Freq: Three times a day (TID) | ORAL | 0 refills | Status: DC
Start: 2017-03-20 — End: 2017-04-19

## 2017-03-20 MED FILL — hydrOXYzine HCL 25 MG TABS: 25 | 30 days supply | Qty: 90 | Fill #0

## 2017-03-20 MED FILL — SERTRALINE HCL 50 MG TABLET: 50 | 30 days supply | Qty: 30 | Fill #0

## 2017-03-20 MED FILL — DICYCLOMINE 10 MG CAPSULE: 10 | 22 days supply | Qty: 90 | Fill #0

## 2017-03-20 MED FILL — ?OMEPRAZOLE DR 20 MG CAPSUL: 20 | 30 days supply | Qty: 30 | Fill #0

## 2017-03-20 NOTE — Progress Notes (Signed)
Patient ID: Rickey Guzman, male    DOB: December 09, 1990, 26 y.o.   MRN: [id number]  PCP: Bing Neighbors, FNP  Chief Complaint  Patient presents with  . Establish Care  . Nausea  . Shortness of Breath  . Chest Pain  . BOWEL ISSUES  . Gastroesophageal Reflux    Subjective:  HPI Rickey Guzman is a 26 y.o. male presents to establish care and evaluation of generalized abdominal complaints.  Medical history significant for polysubstance abuse, anxiety, panic attacks, and major depression. Reports regular recent use of marijuana. He presents with multiple complaints today including a 2 month history of mucus like frequent stools. Reports urgency with defecation. He reports associated lower bilateral abdominal pain that radiates to his back and daily nasuea without vomiting. Symptoms are worsened by foods. Nausea is precipitated by hunger empty stomach sensation. He has attempted relief with ranitidine two weeks prior, but stopped therapy as he thought medication was increasing bowel movements. He also complains chest pain characterized as burning. Denies known history of acid reflux or PUD. He is able to tolerate water and food. Although reports he has missed work due to fear of having a bowel movement on himself. Garrell complains of worsening panic attacks and heart racing. He complains of crying associated with fear of current health, complains of sensation that "his skin is crawling" when he is about to have a bowel movement. He endorses depression, although denies thoughts of suicide or harming others. Social History   Social History  . Marital status: Single    Spouse name: N/A  . Number of children: N/A  . Years of education: N/A   Occupational History  . Not on file.   Social History Main Topics  . Smoking status: Former Smoker    Packs/day: 0.25  . Smokeless tobacco: Never Used  . Alcohol use No  . Drug use: Yes    Types: Opium, Marijuana, "Crack" cocaine, Cocaine   Comment: Hx marijuana and opiate use  . Sexual activity: Yes   Other Topics Concern  . Not on file   Social History Narrative  . No narrative on file    Family History  Problem Relation Age of Onset  . Family history unknown: Yes   Review of Systems See HPI  Patient Active Problem List   Diagnosis Date Noted  . Anxiety state, unspecified 07/03/2013  . Panic attacks 07/03/2013  . Opiate abuse, episodic (HCC) 07/01/2013  . Cannabis abuse 07/01/2013  . MDD (major depressive disorder) 06/30/2013    Allergies  Allergen Reactions  . Penicillins     Reaction when he was little    Prior to Admission medications   Medication Sig Start Date End Date Taking? Authorizing Provider  buPROPion (WELLBUTRIN XL) 300 MG 24 hr tablet Take 1 tablet (300 mg total) by mouth daily. For depression Patient not taking: Reported on 03/20/2017 07/09/13   Armandina Stammer I, NP  gabapentin (NEURONTIN) 400 MG capsule Take 1 capsule (400 mg total) by mouth 3 (three) times daily. For substance withdrawal syndrome/pain management Patient not taking: Reported on 03/20/2017 07/08/13   Armandina Stammer I, NP  hydrOXYzine (ATARAX/VISTARIL) 25 MG tablet Take 1 tablet (25 mg) three time daily for anxiety Patient not taking: Reported on 03/20/2017 07/08/13   Armandina Stammer I, NP  sertraline (ZOLOFT) 25 MG tablet Take 1 tablet (25 mg total) by mouth daily. For depression Patient not taking: Reported on 03/20/2017 07/08/13   Sanjuana Kava, NP  Past Medical, Surgical Family and Social History reviewed and updated.    Objective:   Today's Vitals   03/20/17 0807  BP: 119/86  Pulse: 60  Resp: 16  Temp: 98.6 F (37 C)  TempSrc: Oral  SpO2: 100%  Weight: 197 lb (89.4 kg)  Height: 6' (1.829 m)    Wt Readings from Last 3 Encounters:  03/20/17 197 lb (89.4 kg)   Physical Exam  Constitutional: He is oriented to person, place, and time. He appears well-developed and well-nourished.  HENT:  Head: Normocephalic and  atraumatic.  Eyes: Pupils are equal, round, and reactive to light. Conjunctivae and EOM are normal.  Neck: Normal range of motion. Neck supple.  Cardiovascular: Normal rate, regular rhythm, normal heart sounds and intact distal pulses.   Pulmonary/Chest: Effort normal and breath sounds normal.  Abdominal: Soft. Bowel sounds are normal. He exhibits no distension and no mass. There is tenderness. There is no rebound and no guarding.  Musculoskeletal: Normal range of motion.  Neurological: He is alert and oriented to person, place, and time.  Skin: Skin is warm and dry.  Psychiatric: Judgment and thought content normal. His mood appears anxious. His speech is rapid and/or pressured. He is hyperactive. Cognition and memory are normal.   Assessment & Plan:  1. Generalized abdominal pain, Obtaining KUB to rule out renal stone 2. Flank pain, UA negative. Obtaining KUB to rule out renal stone 3. Chest pain, unspecified type, unremarkable EKG. Negative for ischemia. 4. Generalized Anxiety, will trial hydroxyzine and sertraline. 5. Frequent stools-Will run GI panel and evaluate for malabsorption as stools have been mucus like for 2 months. Less likely an acute GI infestation given the duration of symptoms. 6. Substance abuse -Educated that marijuana symptoms increase anxiety, shortness of breath, and chest pain. Encouraged to cessation.  Orders Placed This Encounter  Procedures  . DG Abd 1 View  . CT ABDOMEN PELVIS W CONTRAST  . Lipase  . Amylase  . CBC with Differential  . Basic metabolic panel  . POCT urinalysis dip (device)  . EKG 12-Lead    Meds ordered this encounter  Medications  . hydrOXYzine (ATARAX/VISTARIL) 25 MG tablet    Sig: Take 1 tablet (25 mg) three time daily for anxiety    Dispense:  90 tablet    Refill:  1    Order Specific Question:   Supervising Provider    Answer:   Quentin Angst [id number]  . sertraline (ZOLOFT) 50 MG tablet    Sig: Take 1 tablet (50 mg  total) by mouth daily. For depression    Dispense:  90 tablet    Refill:  0    Order Specific Question:   Supervising Provider    Answer:   Quentin Angst [id number]  . omeprazole (PRILOSEC) 20 MG capsule    Sig: Take 1 capsule (20 mg total) by mouth daily.    Dispense:  30 capsule    Refill:  3    Order Specific Question:   Supervising Provider    Answer:   Quentin Angst [id number]  . dicyclomine (BENTYL) 10 MG capsule    Sig: Take 1 capsule (10 mg total) by mouth 4 (four) times daily -  before meals and at bedtime.    Dispense:  90 capsule    Refill:  0    Order Specific Question:   Supervising Provider    Answer:   Quentin Angst [id number]    RTC: 1 month medication evaluation for  anxiety and follow-up GI complaints  Godfrey Pick. Tiburcio Pea, MSN, FNP-C [facility]  [address] [place], [place] [address] [phone]

## 2017-03-20 NOTE — Telephone Encounter (Signed)
Please schedule patient for CT scan

## 2017-03-20 NOTE — Patient Instructions (Addendum)
Go downstairs and obtain abdominal x-ray. I will notify you if any abnormality is noted on image.  Pick-up medications from pharmacy, take as indicated.   Return in 4 weeks for

## 2017-03-20 NOTE — Telephone Encounter (Signed)
Tried to call patient no answer and no vm set up 

## 2017-03-21 ENCOUNTER — Other Ambulatory Visit: Payer: Self-pay | Admitting: Family Medicine

## 2017-03-21 DIAGNOSIS — K529 Noninfective gastroenteritis and colitis, unspecified: Secondary | ICD-10-CM

## 2017-03-21 DIAGNOSIS — R195 Other fecal abnormalities: Secondary | ICD-10-CM

## 2017-03-21 NOTE — Telephone Encounter (Signed)
Patient grandfather notified of appointment and will let patient know

## 2017-03-21 NOTE — Progress Notes (Signed)
Lab orders only 

## 2017-03-22 ENCOUNTER — Ambulatory Visit (HOSPITAL_COMMUNITY)
Admission: RE | Admit: 2017-03-22 | Discharge: 2017-03-22 | Disposition: A | Discharge status: Home / Self Care | Payer: Self-pay | Source: Ambulatory Visit | Attending: Family Medicine | Admitting: Family Medicine

## 2017-03-22 ENCOUNTER — Encounter (HOSPITAL_COMMUNITY): Payer: Self-pay

## 2017-03-22 DIAGNOSIS — R1084 Generalized abdominal pain: Secondary | ICD-10-CM

## 2017-03-22 DIAGNOSIS — R109 Unspecified abdominal pain: Secondary | ICD-10-CM

## 2017-03-22 LAB — TIQ-NTM

## 2017-03-22 NOTE — Progress Notes (Signed)
Per Nicholes Calamity at [facility] she will close out the test in question and have all three test run.

## 2017-03-25 ENCOUNTER — Encounter: Payer: Self-pay | Admitting: Family Medicine

## 2017-03-25 LAB — FECAL FAT, QUALITATIVE: FECAL FAT, QUALITATIVE: NORMAL

## 2017-03-29 LAB — GASTROINTESTINAL PATHOGEN PANEL PCR
C. difficile Tox A/B, PCR: DETECTED — AB
CAMPYLOBACTER, PCR: NOT DETECTED
Cryptosporidium, PCR: NOT DETECTED
E coli (ETEC) LT/ST PCR: NOT DETECTED
E coli (STEC) stx1/stx2, PCR: NOT DETECTED
E coli 0157, PCR: NOT DETECTED
Giardia lamblia, PCR: NOT DETECTED
Norovirus, PCR: NOT DETECTED
Rotavirus A, PCR: NOT DETECTED
SALMONELLA, PCR: NOT DETECTED
SHIGELLA, PCR: NOT DETECTED

## 2017-03-29 LAB — CELIAC PNL 2 RFLX ENDOMYSIAL AB TTR

## 2017-04-01 ENCOUNTER — Telehealth: Payer: Self-pay | Admitting: Family Medicine

## 2017-04-01 MED ORDER — VANCOMYCIN HCL 125 MG PO CAPS: 125.0000 mg | ORAL_CAPSULE | Freq: Four times a day (QID) | ORAL | 0 refills | Status: AC

## 2017-04-01 NOTE — Telephone Encounter (Signed)
   04/01/17 12:10 PM  Lyla Sonarrie, please handle. Note    Contact patient to advise his GI pathogen panel resulted and he is infected with C difficile which is a bacteria that resides in the intestinal tract. I am placing him on 10 days of vancomycin orally. After completion of the antibiotics, he will need to submit another stool sample to ensure infection has cleared. I [facility] the [facility] to [facility] as it costs exceeds $100 in retail stores.

## 2017-04-01 NOTE — Telephone Encounter (Signed)
Contact patient to advise his GI pathogen panel resulted and he is infected with C difficile which is a bacteria that resides in the intestinal tract. I am placing him on 10 days of vancomycin orally. After completion of the antibiotics, he will need to submit another stool sample to ensure infection has cleared. I have sent the medication to [facility] as it costs exceeds $100 in retail stores.   [name], MSN, FNP-C [facility]  [address] [place], [place] [address] [phone]

## 2017-04-02 MED FILL — VANCOMYCIN HCL 125 MG CAP: 125 | 10 days supply | Qty: 40 | Fill #0

## 2017-04-02 NOTE — Telephone Encounter (Signed)
Patient notified and will pick up medication and come back in once complete.

## 2017-04-06 ENCOUNTER — Encounter (HOSPITAL_COMMUNITY): Payer: Self-pay | Admitting: *Deleted

## 2017-04-06 ENCOUNTER — Emergency Department (HOSPITAL_COMMUNITY): Payer: Self-pay

## 2017-04-06 ENCOUNTER — Emergency Department (HOSPITAL_COMMUNITY)
Admission: EM | Admit: 2017-04-06 | Discharge: 2017-04-06 | Disposition: A | Discharge status: Home / Self Care | Payer: Self-pay | Attending: Emergency Medicine | Admitting: Emergency Medicine

## 2017-04-06 DIAGNOSIS — W1789XA Other fall from one level to another, initial encounter: Secondary | ICD-10-CM | POA: Insufficient documentation

## 2017-04-06 DIAGNOSIS — Y9389 Activity, other specified: Secondary | ICD-10-CM | POA: Insufficient documentation

## 2017-04-06 DIAGNOSIS — Z79899 Other long term (current) drug therapy: Secondary | ICD-10-CM | POA: Insufficient documentation

## 2017-04-06 DIAGNOSIS — Y999 Unspecified external cause status: Secondary | ICD-10-CM | POA: Insufficient documentation

## 2017-04-06 DIAGNOSIS — Z87891 Personal history of nicotine dependence: Secondary | ICD-10-CM | POA: Insufficient documentation

## 2017-04-06 DIAGNOSIS — S93402A Sprain of unspecified ligament of left ankle, initial encounter: Secondary | ICD-10-CM | POA: Insufficient documentation

## 2017-04-06 DIAGNOSIS — Y929 Unspecified place or not applicable: Secondary | ICD-10-CM | POA: Insufficient documentation

## 2017-04-06 MED ORDER — IBUPROFEN 600 MG PO TABS: 600.0000 mg | ORAL_TABLET | Freq: Four times a day (QID) | ORAL | 0 refills | Status: AC | PRN

## 2017-04-06 NOTE — Discharge Instructions (Signed)
Please see the information and instructions below regarding your visit.  Your diagnoses today include:  1. Sprain of left ankle, unspecified ligament, initial encounter    Your provider has diagnosed you as suffering from an ankle sprain. Ankle sprain occurs when the ligaments that hold the ankle joint together are stretched or torn. It may take 4 to 6 weeks to heal. Your ankle sprain is likely more severe than prior so you may need more time to heal.  Tests performed today include: An x-ray of your ankle - does NOT show any broken bones  See side panel of your discharge paperwork for testing performed today. Vital signs are listed at the bottom of these instructions.   Medications prescribed:  Take any prescribed medications only as prescribed, and any over the counter medications only as directed on the packaging.  Home care instructions:  Follow R.I.C.E. Protocol: R - rest your injury  I  - use ice on injury without applying directly to skin C - compress injury with bandage or splint E - elevate the injury as much as possible  For Activity: Wear ankle brace for at least 2 weeks for stabilization of ankle. If prescribed crutches, use crutches with non-weight bearing for the first few days. Then, you may walk on your ankle as the pain allows, or as instructed. Start gradually with weight bearing on the affected ankle. Once you can walk pain free, then try jogging. When you can run forwards, then you can try moving side-to-side. If you cannot walk without crutches in one week, you need a re-check.  Please follow any educational materials contained in this packet.   Follow-up instructions: Please follow-up with your primary care provider or the provided orthopedic (bone specialist) listed in this packet if you continue to have significant pain or trouble walking in 1 week. In this case you may have a severe sprain that requires further care.   Return instructions:  Please return if your  toes are numb or tingling, appear gray or blue, are much colder than your other foot, or you have severe pain (also elevate leg and loosen splint or wrap). Please return to the Emergency Department if you experience worsening symptoms.  Please return if you have any other emergent concerns.  Additional Information:   Your vital signs today were: BP 114/72 (BP Location: Left Arm)    Pulse 60    Temp 98.1 F (36.7 C) (Oral)    Resp 18    Ht 6' (1.829 m)    Wt 86.2 kg (190 lb)    SpO2 100%    BMI 25.77 kg/m  If your blood pressure (BP) was elevated on multiple readings during this visit above 130 for the top number or above 80 for the bottom number, please have this repeated by your primary care provider within one month. --------------  Thank you for allowing us to participate in your care today.

## 2017-04-06 NOTE — ED Notes (Signed)
I did not evaluate this pt. I did, however, obtain and give him his d/c information, including instructions/rx and work note. He thanks us for our care and tell us he is glad he does not have a fracture.

## 2017-04-06 NOTE — ED Provider Notes (Signed)
[facility]-EMERGENCY DEPT Provider Note   CSN: [id number] Arrival date & time: 04/06/17  1246     History   Chief Complaint Chief Complaint  Patient presents with  . Ankle Injury    HPI KAULDER ZAHNER is a 26 y.o. male.  HPI   Patient is a 26 year old male with a history of anxiety and depression presenting for ankle injury after rolling the left ankle yesterday.  Patient reports he was jumping off of a truck at work and came down wrong on his left ankle.  Patient reports he was weightbearing after the incident.  Patient reports he has seen swelling and increased pain over the last 24 hours.  Patient was nonweightbearing this morning.  Patient noted ecchymosis around the left lateral ankle, but has not noted any weakness, numbness, or pain out of proportion to injury.  Ice was applied last night to the ankle but no other remedies tried. No history easy bruising or bleeding.  Past Medical History:  Diagnosis Date  . Anxiety   . Depression     Patient Active Problem List   Diagnosis Date Noted  . Anxiety state, unspecified 07/03/2013  . Panic attacks 07/03/2013  . Opiate abuse, episodic (HCC) 07/01/2013  . Cannabis abuse 07/01/2013  . MDD (major depressive disorder) 06/30/2013    History reviewed. No pertinent surgical history.     Home Medications    Prior to Admission medications   Medication Sig Start Date End Date Taking? Authorizing Provider  buPROPion (WELLBUTRIN XL) 300 MG 24 hr tablet Take 1 tablet (300 mg total) by mouth daily. For depression Patient not taking: Reported on 03/20/2017 07/09/13   Armandina Stammer I, NP  dicyclomine (BENTYL) 10 MG capsule Take 1 capsule (10 mg total) by mouth 4 (four) times daily -  before meals and at bedtime. 03/20/17   Bing Neighbors, FNP  gabapentin (NEURONTIN) 400 MG capsule Take 1 capsule (400 mg total) by mouth 3 (three) times daily. For substance withdrawal syndrome/pain management Patient not  taking: Reported on 03/20/2017 07/08/13   Armandina Stammer I, NP  hydrOXYzine (ATARAX/VISTARIL) 25 MG tablet Take 1 tablet (25 mg) three time daily for anxiety 03/20/17   Bing Neighbors, FNP  omeprazole (PRILOSEC) 20 MG capsule Take 1 capsule (20 mg total) by mouth daily. 03/20/17   Bing Neighbors, FNP  sertraline (ZOLOFT) 50 MG tablet Take 1 tablet (50 mg total) by mouth daily. For depression 03/20/17   Bing Neighbors, FNP  vancomycin (VANCOCIN HCL) 125 MG capsule Take 1 capsule (125 mg total) by mouth 4 (four) times daily. 04/01/17   Bing Neighbors, FNP    Family History Family History  Problem Relation Age of Onset  . Family history unknown: Yes    Social History Social History  Substance Use Topics  . Smoking status: Former Smoker    Packs/day: 0.25  . Smokeless tobacco: Never Used  . Alcohol use No     Allergies   Penicillins   Review of Systems Review of Systems  Musculoskeletal: Positive for joint swelling.  Skin: Positive for color change. Negative for pallor.  Neurological: Negative for weakness and numbness.     Physical Exam Updated Vital Signs BP 114/72 (BP Location: Left Arm)   Pulse 60   Temp 98.1 F (36.7 C) (Oral)   Resp 18   Ht 6' (1.829 m)   Wt 86.2 kg (190 lb)   SpO2 100%   BMI 25.77 kg/m  Physical Exam  Constitutional: He appears well-developed and well-nourished. No distress.  Sitting comfortably in bed.  HENT:  Head: Normocephalic and atraumatic.  Eyes: Conjunctivae are normal. Right eye exhibits no discharge. Left eye exhibits no discharge.  EOMs normal to gross examination.  Neck: Normal range of motion.  Cardiovascular: Normal rate and regular rhythm.   Intact, 2+ radial pulse. Intact, 2+ dorsalis pedis pulse on left.  PT pulse unable to palpate on left due to swelling.  Pulmonary/Chest:  Normal respiratory effort. Patient converses comfortably. No audible wheeze or stridor.  Abdominal: He exhibits no distension.    Musculoskeletal: Normal range of motion.  Range of motion limited in left ankle with flexion, extension, inversion, and eversion.  Edema and ecchymosis of lateral and medial malleolus of left ankle.  Pain to palpation of lateral malleolus and medial malleolus on left ankle.   Neurological: He is alert.  Cranial nerves intact to gross observation. Sensation intact to light touch in distal LLE.  Skin: Skin is warm and dry. He is not diaphoretic.  Ecchymosis of left lateral ankle.  Psychiatric: He has a normal mood and affect. His behavior is normal. Judgment and thought content normal.  Nursing note and vitals reviewed.    ED Treatments / Results  Labs (all labs ordered are listed, but only abnormal results are displayed) Labs Reviewed - No data to display  EKG  EKG Interpretation None       Radiology Dg Ankle Complete Left  Result Date: 04/06/2017 CLINICAL DATA:  Left ankle twisting injury yesterday. EXAM: LEFT ANKLE COMPLETE - 3+ VIEW COMPARISON:  None. FINDINGS: No acute fracture or malalignment. The talar dome is intact. The ankle mortise is symmetric. Small tibiotalar joint effusion. Soft tissue swelling over the lateral malleolus. Bone mineralization is normal. IMPRESSION: Soft tissue swelling over the lateral malleolus. No acute osseous abnormality. Electronically Signed   By: Obie DredgeWilliam T Derry M.D.   On: 04/06/2017 15:10    Procedures Procedures (including critical care time)  Medications Ordered in ED Medications - No data to display   Initial Impression / Assessment and Plan / ED Course  I have reviewed the triage vital signs and the nursing notes.  Pertinent labs & imaging results that were available during my care of the patient were reviewed by me and considered in my medical decision making (see chart for details).     Final Clinical Impressions(s) / ED Diagnoses   Final diagnoses:  None   Patient is well-appearing, and in no acute distress on  examination.  The left ankle is edematous with ecchymosis, consistent with injury that occurred 24 hours ago.  X-ray negative for fracture.  Instructions given for RICE therapy and patient instructed that the injury may take 4-6 weeks to heal.  No evidence of compartment syndrome on exam today.  Patient given return precautions for any weakness, loss of sensation, loss of pulses or pallor of the left lower extremity.  Patient is in understanding and agrees with plan of care.   New Prescriptions New Prescriptions   No medications on file     Delia ChimesMurray, Alyssa B, PA-C 04/06/17 1559    Tilden Fossaees, Elizabeth, MD 04/07/17 1326

## 2017-04-06 NOTE — ED Triage Notes (Signed)
Pt complains of left ankle pain since rolling his ankle yesterday afternoon. Pt states he needs a note for work and would also like to have it evaluated.

## 2017-04-17 ENCOUNTER — Ambulatory Visit: Payer: Self-pay | Admitting: Family Medicine

## 2017-04-19 ENCOUNTER — Other Ambulatory Visit: Payer: Self-pay

## 2017-04-19 MED ORDER — DICYCLOMINE HCL 10 MG PO CAPS: 10.0000 mg | ORAL_CAPSULE | Freq: Three times a day (TID) | ORAL | 2 refills | Status: AC

## 2017-04-19 MED FILL — DICYCLOMINE 10 MG CAPSULE: 10 | 22 days supply | Qty: 90 | Fill #0 | Status: TO

## 2017-06-26 ENCOUNTER — Other Ambulatory Visit: Payer: Self-pay | Admitting: Gastroenterology

## 2017-06-26 DIAGNOSIS — R935 Abnormal findings on diagnostic imaging of other abdominal regions, including retroperitoneum: Secondary | ICD-10-CM

## 2017-07-02 ENCOUNTER — Ambulatory Visit
Admission: RE | Admit: 2017-07-02 | Discharge: 2017-07-02 | Disposition: A | Discharge status: Home / Self Care | Payer: Managed Care, Other (non HMO) | Source: Ambulatory Visit | Attending: Gastroenterology | Admitting: Gastroenterology

## 2017-07-02 DIAGNOSIS — R935 Abnormal findings on diagnostic imaging of other abdominal regions, including retroperitoneum: Secondary | ICD-10-CM

## 2017-07-02 MED ORDER — IOPAMIDOL (ISOVUE-300) INJECTION 61%
125.0000 mL | Freq: Once | INTRAVENOUS | Status: AC | PRN
  Administered 2017-07-02: 125 mL via INTRAVENOUS

## 2019-01-12 IMAGING — DX DG ABDOMEN 1V
1 series · 1 of 1 positions shown · non-contrast
Comparison: None.

CLINICAL DATA: Diffuse abdominal discomfort.  Nausea

EXAM:
ABDOMEN - 1 VIEW

[abdomen kub]
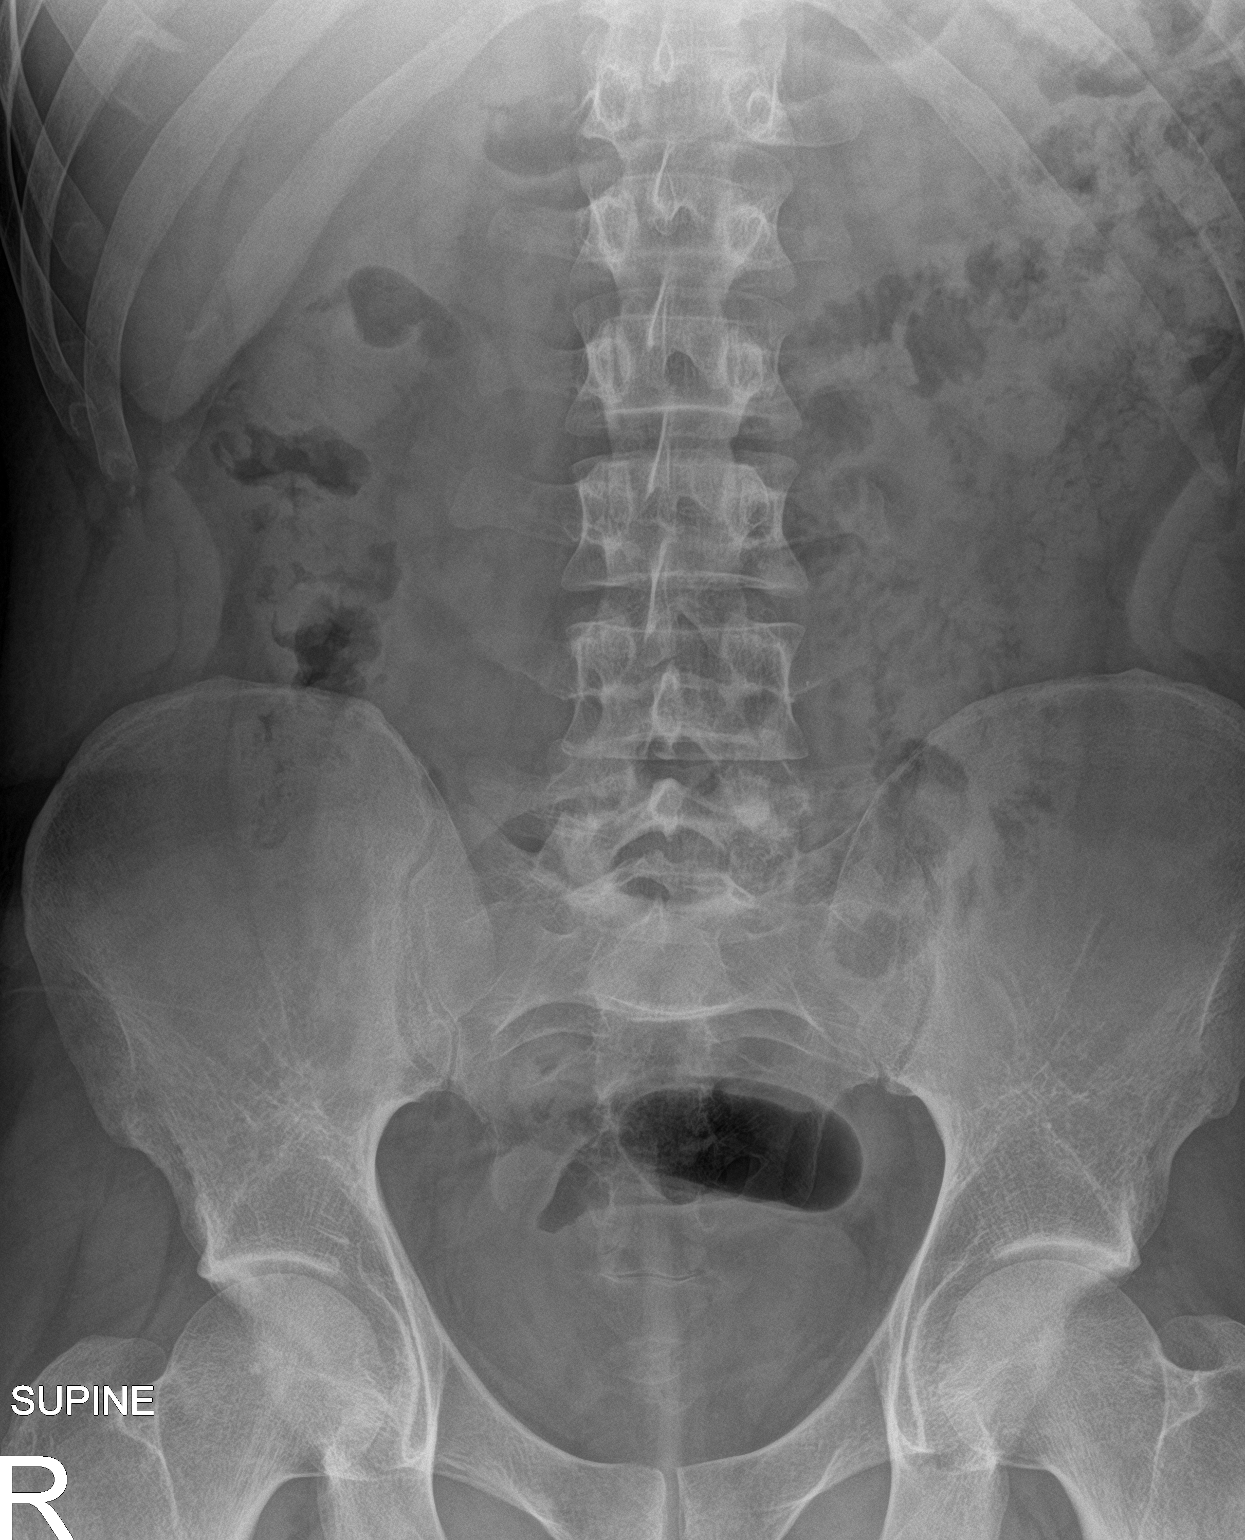

[1 of 1 positions shown; findings below may reference images not displayed]

FINDINGS: No dilated large or small bowel. Gas in the rectum and sigmoid
colon.

Rounded density in the LEFT mid abdomen measures 2.3 cm. No
additional pathologic calcifications. No organomegaly.

No osseous abnormality
IMPRESSION: 1. Round 2.3 cm density in the LEFT mid abdomen of unclear etiology.
This could represent stool but cannot exclude a calcified body.
Consider repeat abdominal films or contrast CT of the abdomen
pelvis.
2. Otherwise no acute abdominal or pelvic findings.
These results will be called to the ordering clinician or
representative by the Radiologist Assistant, and communication
documented in the PACS or zVision Dashboard.
